# Patient Record
Sex: Female | Born: 1985 | Race: White | Hispanic: No | Marital: Married | Smoking: Current every day smoker
Health system: Southern US, Community
[De-identification: ages and names within clinical notes are randomized; demographics above are authoritative.]

## PROBLEM LIST (undated history)

## (undated) DIAGNOSIS — F329 Major depressive disorder, single episode, unspecified: Secondary | ICD-10-CM

## (undated) DIAGNOSIS — K5792 Diverticulitis of intestine, part unspecified, without perforation or abscess without bleeding: Secondary | ICD-10-CM

## (undated) DIAGNOSIS — R011 Cardiac murmur, unspecified: Secondary | ICD-10-CM

## (undated) DIAGNOSIS — F32A Depression, unspecified: Secondary | ICD-10-CM

## (undated) DIAGNOSIS — I10 Essential (primary) hypertension: Secondary | ICD-10-CM

## (undated) DIAGNOSIS — R112 Nausea with vomiting, unspecified: Secondary | ICD-10-CM

## (undated) DIAGNOSIS — K529 Noninfective gastroenteritis and colitis, unspecified: Secondary | ICD-10-CM

## (undated) DIAGNOSIS — R519 Headache, unspecified: Secondary | ICD-10-CM

## (undated) DIAGNOSIS — F909 Attention-deficit hyperactivity disorder, unspecified type: Secondary | ICD-10-CM

## (undated) DIAGNOSIS — Z9889 Other specified postprocedural states: Secondary | ICD-10-CM

## (undated) DIAGNOSIS — R51 Headache: Secondary | ICD-10-CM

## (undated) DIAGNOSIS — K219 Gastro-esophageal reflux disease without esophagitis: Secondary | ICD-10-CM

## (undated) HISTORY — DX: Diverticulitis of intestine, part unspecified, without perforation or abscess without bleeding: K57.92

## (undated) HISTORY — DX: Essential (primary) hypertension: I10

## (undated) HISTORY — DX: Attention-deficit hyperactivity disorder, unspecified type: F90.9

---
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
## 2008-08-05 ENCOUNTER — Emergency Department (HOSPITAL_COMMUNITY): Admission: EM | Admit: 2008-08-05 | Discharge: 2008-08-05 | Payer: Self-pay | Admitting: Family Medicine

## 2009-03-04 HISTORY — PX: COLON SURGERY: SHX602

## 2015-10-06 DIAGNOSIS — R0602 Shortness of breath: Secondary | ICD-10-CM | POA: Diagnosis not present

## 2015-10-06 DIAGNOSIS — Z0000 Encounter for general adult medical examination without abnormal findings: Secondary | ICD-10-CM | POA: Diagnosis not present

## 2015-10-06 DIAGNOSIS — Z114 Encounter for screening for human immunodeficiency virus [HIV]: Secondary | ICD-10-CM | POA: Diagnosis not present

## 2015-10-06 DIAGNOSIS — Z1389 Encounter for screening for other disorder: Secondary | ICD-10-CM | POA: Diagnosis not present

## 2015-10-13 MED FILL — AMLODIPINE BESYLATE 10 MG T: 10 | 30 days supply | Qty: 30 | Fill #0

## 2015-11-02 ENCOUNTER — Telehealth: Payer: Self-pay | Admitting: *Deleted

## 2015-11-02 NOTE — Telephone Encounter (Signed)
Unable to reach patient at time of Pre-Visit Call.  Left message for patient to return call when available.    

## 2015-11-03 ENCOUNTER — Ambulatory Visit (INDEPENDENT_AMBULATORY_CARE_PROVIDER_SITE_OTHER): Payer: 59 | Admitting: Family Medicine

## 2015-11-03 ENCOUNTER — Encounter: Payer: Self-pay | Admitting: Family Medicine

## 2015-11-03 VITALS — BP 130/80 | HR 107 | Temp 98.1°F | Ht 64.0 in | Wt 170.6 lb

## 2015-11-03 DIAGNOSIS — F909 Attention-deficit hyperactivity disorder, unspecified type: Secondary | ICD-10-CM

## 2015-11-03 DIAGNOSIS — F900 Attention-deficit hyperactivity disorder, predominantly inattentive type: Secondary | ICD-10-CM | POA: Insufficient documentation

## 2015-11-03 DIAGNOSIS — F902 Attention-deficit hyperactivity disorder, combined type: Secondary | ICD-10-CM

## 2015-11-03 DIAGNOSIS — R5382 Chronic fatigue, unspecified: Secondary | ICD-10-CM

## 2015-11-03 DIAGNOSIS — R635 Abnormal weight gain: Secondary | ICD-10-CM | POA: Diagnosis not present

## 2015-11-03 HISTORY — DX: Attention-deficit hyperactivity disorder, unspecified type: F90.9

## 2015-11-03 LAB — TSH: TSH: 1.31 u[IU]/mL (ref 0.35–4.50)

## 2015-11-03 MED ORDER — AMPHETAMINE-DEXTROAMPHET ER 20 MG PO CP24: 20.0000 mg | ORAL_CAPSULE | ORAL | 0 refills | Status: DC

## 2015-11-03 MED FILL — DEXTROAMP-AMPHET ER 20 MG C: 20 | 30 days supply | Qty: 30 | Fill #0

## 2015-11-03 NOTE — Progress Notes (Signed)
Chief Complaint  Patient presents with  . Establish Care    Pt needs to discuss restarting ADHD medications, and want thyroid checked.       New Patient Visit SUBJECTIVE: HPI: Nichole Mcintosh is an 30 y.o.female who is being seen for establishing care.  [facility]. UTD with health maintenance except for pap- follows with GYN for that.  ADHD Ritalin In the past that worked well, never been on anything else. She has had symptoms since she was a small child.  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 Denies hx of weight loss, poor appetite, facial tics or insomnia while on the Ritalin.   HTN On Norvasc, BP doing well. No CP or SOB. Exercise and diet could be better. No issues or compliance issues with medication.  Fatigue She also notes that she has fatigue for most of the day in addition to weight gain. She would like to have her thyroid checked.  No Known Allergies  Past Medical History:  Diagnosis Date  . ADHD (attention deficit hyperactivity disorder)   . Attention deficit hyperactivity disorder (ADHD) 11/03/2015  . Diverticulitis   . Hypertension    Past Surgical History:  Procedure Laterality Date  . COLON SURGERY     Social History   Social History  . Marital status: Married   Social History Main Topics  . Smoking status: Former Smoker    Types: Cigarettes    Quit date: 11/02/2012  . Smokeless tobacco: Never Used  . Alcohol use Yes  . Drug use: No   Family History  Problem Relation Age of Onset  . Hypertension Mother   . Heart disease Maternal Grandmother   . Cancer Maternal Grandmother     Lung  . Heart disease Maternal Grandfather     Current Outpatient Prescriptions:  .  amLODipine (NORVASC) 10 MG tablet, Take 10 mg by mouth daily., Disp: , Rfl:  .  amphetamine-dextroamphetamine (ADDERALL XR) 20 MG 24 hr capsule, Take 1 capsule (20 mg total) by mouth every morning.,  Disp: 30 capsule, Rfl: 0  Patient's last menstrual period was 10/23/2015 (exact date).  ROS Cardiovascular: Denies chest pain or pressure, palpitations  Psych: +inattention and forgetfulness   OBJECTIVE: BP 130/80 (BP Location: Left Arm, Patient Position: Sitting, Cuff Size: Normal)   Pulse (!) 107   Temp 98.1 F (36.7 C) (Oral)   Ht 5\' 4"  (1.626 m)   Wt 170 lb 9.6 oz (77.4 kg)   LMP 10/23/2015 (Exact Date)   SpO2 98%   BMI 29.28 kg/m   Constitutional: -  VS reviewed -  Well developed, well nourished, appears stated age -  No apparent distress  Psychiatric: -  Oriented to person, place, and time -  Memory intact -  Affect and mood normal -  Fluent conversation, good eye contact -  Judgment and insight age appropriate  Eye: -  Conjunctivae clear, no discharge -  Pupils symmetric, round, reactive to light  ENMT: -  Ears are patent b/l without erythema or discharge. TM's are shiny and clear b/l without evidence of effusion or infection. -  Oral mucosa without lesions, tongue and uvula midline    Tonsils not enlarged, no erythema, no exudate, trachea midline    Pharynx moist, no lesions, no erythema  Neck: -  No gross swelling, no palpable masses -  Thyroid midline, not enlarged, mobile, no palpable masses  Cardiovascular: -  RRR, no murmurs- she was not tachycardic when I auscultated -  No LE edema  Respiratory: -  Normal respiratory effort, no accessory muscle use, no retraction -  Breath sounds equal, no wheezes, no ronchi, no crackles  Neurological:  -  CN II - XII grossly intact -  No facial tics -  Sensation grossly intact to light touch, equal bilaterally  Musculoskeletal: -  No clubbing, no cyanosis -  Gait normal  Skin: -  No significant lesion on inspection -  Warm and dry to palpation   ASSESSMENT/PLAN: Attention deficit hyperactivity disorder (ADHD), combined type - Plan: amphetamine-dextroamphetamine (ADDERALL XR) 20 MG 24 hr capsule  Weight gain - Plan:  TSH  Chronic fatigue - Plan: TSH  Patient instructed to sign release of records form from her previous PCP. Orders as above. Controlled substance contract signed. Reviewed her labs and her LDL is 188. Will consider rechecking in future. Did bring up possibility of cholesterol lowering medication despite age. Patient should return in 4 weeks to recheck ADHD medication. The patient voiced understanding and agreement to the plan.   Nichole Mcintosh

## 2015-11-03 NOTE — Progress Notes (Signed)
Pre visit review using our clinic review tool, if applicable. No additional management support is needed unless otherwise documented below in the visit note. 

## 2015-11-03 NOTE — Patient Instructions (Signed)
Power Step are OTC insoles that are cheapest on [employer].

## 2015-11-10 MED FILL — AMLODIPINE BESYLATE 10 MG T: 10 | 60 days supply | Qty: 60 | Fill #1 | Status: TO

## 2015-11-24 ENCOUNTER — Encounter: Payer: Self-pay | Admitting: Family Medicine

## 2015-12-01 ENCOUNTER — Ambulatory Visit: Payer: 59 | Admitting: Family Medicine

## 2015-12-08 ENCOUNTER — Ambulatory Visit (INDEPENDENT_AMBULATORY_CARE_PROVIDER_SITE_OTHER): Payer: 59 | Admitting: Family Medicine

## 2015-12-08 ENCOUNTER — Encounter: Payer: Self-pay | Admitting: Family Medicine

## 2015-12-08 VITALS — BP 138/80 | HR 103 | Temp 98.1°F | Wt 168.8 lb

## 2015-12-08 DIAGNOSIS — F900 Attention-deficit hyperactivity disorder, predominantly inattentive type: Secondary | ICD-10-CM | POA: Diagnosis not present

## 2015-12-08 DIAGNOSIS — Z79899 Other long term (current) drug therapy: Secondary | ICD-10-CM | POA: Diagnosis not present

## 2015-12-08 DIAGNOSIS — I10 Essential (primary) hypertension: Secondary | ICD-10-CM | POA: Insufficient documentation

## 2015-12-08 MED ORDER — METOPROLOL SUCCINATE ER 50 MG PO TB24: 50.0000 mg | ORAL_TABLET | Freq: Every day | ORAL | 1 refills | Status: DC

## 2015-12-08 MED ORDER — AMPHETAMINE-DEXTROAMPHETAMINE 10 MG PO TABS: ORAL_TABLET | ORAL | 0 refills | Status: DC

## 2015-12-08 MED ORDER — AMPHETAMINE-DEXTROAMPHET ER 20 MG PO CP24: 20.0000 mg | ORAL_CAPSULE | ORAL | 0 refills | Status: DC

## 2015-12-08 MED FILL — METOPROLOL SUCC ER 50 MG TA: 50 | 60 days supply | Qty: 60 | Fill #0

## 2015-12-08 MED FILL — DEXTROAMP-AMPHETAMIN 10 MG: 10 | 30 days supply | Qty: 30 | Fill #0

## 2015-12-08 MED FILL — DEXTROAMP-AMPHET ER 20 MG C: 20 | 30 days supply | Qty: 30 | Fill #0

## 2015-12-08 NOTE — Progress Notes (Signed)
Pre visit review using our clinic review tool, if applicable. No additional management support is needed unless otherwise documented below in the visit note. 

## 2015-12-08 NOTE — Progress Notes (Signed)
Chief Complaint  Patient presents with  . ADHD    Subjective: Patient is a 30 y.o. female here for ADHD f/u.  Hx of ADHD, stopped meds for a while, started up again for work. 20 mg Adderall XR on days she works. She is a Engineer, civil (consulting)nurse. She feels her symptoms are well controlled in AM and the medication tapers off around 3-4 PM. No facial tics, weight loss, insomnia, or palpitations  Hypertension Patient presents for hypertension follow up. She does monitor home blood pressures. Blood pressures ranging 140's/80's. She is compliant with medications- Norvasc 10 mg daily. Patient has these side effects of medication: none She specifically denies headache, visual changes, chest pain, palpitations, dyspnea, orthopnea, PND or peripheral edema. She is adhering to a low sodium and low fat diet. She exercises rarely.   ROS: Heart: Denies palpitations Neuro: As noted in HPI  Family History  Problem Relation Age of Onset  . Hypertension Mother   . Heart disease Maternal Grandmother   . Cancer Maternal Grandmother     Lung  . Heart disease Maternal Grandfather    Past Medical History:  Diagnosis Date  . Attention deficit hyperactivity disorder (ADHD) 11/03/2015  . Diverticulitis   . Hypertension    No Known Allergies  Current Outpatient Prescriptions:  .  amLODipine (NORVASC) 10 MG tablet, Take 10 mg by mouth daily., Disp: , Rfl:  .  amphetamine-dextroamphetamine (ADDERALL XR) 20 MG 24 hr capsule, Take 1 capsule (20 mg total) by mouth every morning., Disp: 30 capsule, Rfl: 0 .  amphetamine-dextroamphetamine (ADDERALL) 10 MG tablet, Take 1 tab in the afternoon you work., Disp: 30 tablet, Rfl: 0 .  metoprolol succinate (TOPROL-XL) 50 MG 24 hr tablet, Take 1 tablet (50 mg total) by mouth daily. Take with or immediately following a meal., Disp: 30 tablet, Rfl: 1  Objective: BP 138/80 (BP Location: Left Arm, Patient Position: Sitting, Cuff Size: Normal)   Pulse (!) 103   Temp 98.1 F (36.7 C)  (Oral)   Wt 168 lb 12.8 oz (76.6 kg)   SpO2 97%   BMI 28.97 kg/m  General: Awake, appears stated age Heart: RRR when I listened, no murmurs, no LE edema Lungs: CTAB, no rales, wheezes or rhonchi. Normal effort  Abd: BS+, soft, NT, ND, no masses or organomegaly Psych: Age appropriate judgment and insight, normal affect and mood  Assessment and Plan: Attention deficit hyperactivity disorder (ADHD), predominantly inattentive type  Essential hypertension - Plan: metoprolol succinate (TOPROL-XL) 50 MG 24 hr tablet  Attention deficit hyperactivity disorder (ADHD), combined type - Plan: amphetamine-dextroamphetamine (ADDERALL) 10 MG tablet, amphetamine-dextroamphetamine (ADDERALL XR) 20 MG 24 hr capsule  Orders as above. Add 10 mg in afternoon for her 12 hour shift. I asked if she is an anxious person, she said it is due to work, but she believes she is coping well. Will keep an eye on this.  Given her pulse, will add Toprol. She is prehypertensive today. Will follow up in 4 weeks to recheck ADHD and BP. UDS collected today. The patient voiced understanding and agreement to the plan.  Jilda Rocheicholas Paul [place], DO 12/08/15  1:21 PM

## 2015-12-08 NOTE — Patient Instructions (Signed)
Take your 2nd pill (10 mg tab) around 1-2 in the afternoon.

## 2016-01-09 ENCOUNTER — Other Ambulatory Visit: Payer: Self-pay | Admitting: Family Medicine

## 2016-01-09 DIAGNOSIS — F900 Attention-deficit hyperactivity disorder, predominantly inattentive type: Secondary | ICD-10-CM

## 2016-01-09 MED FILL — AMLODIPINE BESYLATE 10 MG T: 10 | 30 days supply | Qty: 30 | Fill #0

## 2016-01-09 NOTE — Telephone Encounter (Signed)
Pt would like to know if she could have a 90 day supply if possible.     When spoke with pt she says that she would like to have [facility] instead

## 2016-01-10 ENCOUNTER — Other Ambulatory Visit: Payer: Self-pay | Admitting: Family Medicine

## 2016-01-10 DIAGNOSIS — F900 Attention-deficit hyperactivity disorder, predominantly inattentive type: Secondary | ICD-10-CM

## 2016-01-10 MED ORDER — AMPHETAMINE-DEXTROAMPHET ER 20 MG PO CP24: 20.0000 mg | ORAL_CAPSULE | ORAL | 0 refills | Status: DC

## 2016-01-17 DIAGNOSIS — H5212 Myopia, left eye: Secondary | ICD-10-CM | POA: Diagnosis not present

## 2016-01-17 DIAGNOSIS — H52223 Regular astigmatism, bilateral: Secondary | ICD-10-CM | POA: Diagnosis not present

## 2016-01-18 MED FILL — DEXTROAMP-AMPHET ER 20 MG C: 20 | 30 days supply | Qty: 30 | Fill #0

## 2016-01-19 ENCOUNTER — Ambulatory Visit: Payer: 59 | Admitting: Family Medicine

## 2016-02-01 ENCOUNTER — Encounter: Payer: 59 | Admitting: Obstetrics & Gynecology

## 2016-02-05 ENCOUNTER — Ambulatory Visit (INDEPENDENT_AMBULATORY_CARE_PROVIDER_SITE_OTHER): Payer: 59 | Admitting: Family Medicine

## 2016-02-05 ENCOUNTER — Encounter: Payer: Self-pay | Admitting: Family Medicine

## 2016-02-05 ENCOUNTER — Other Ambulatory Visit: Payer: Self-pay | Admitting: Family Medicine

## 2016-02-05 VITALS — BP 124/84 | HR 99 | Temp 97.6°F | Ht 64.0 in | Wt 161.2 lb

## 2016-02-05 DIAGNOSIS — L02429 Furuncle of limb, unspecified: Secondary | ICD-10-CM

## 2016-02-05 DIAGNOSIS — F900 Attention-deficit hyperactivity disorder, predominantly inattentive type: Secondary | ICD-10-CM

## 2016-02-05 DIAGNOSIS — F418 Other specified anxiety disorders: Secondary | ICD-10-CM

## 2016-02-05 DIAGNOSIS — I10 Essential (primary) hypertension: Secondary | ICD-10-CM | POA: Diagnosis not present

## 2016-02-05 MED ORDER — AMLODIPINE BESYLATE 10 MG PO TABS: 10.0000 mg | ORAL_TABLET | Freq: Every day | ORAL | 1 refills | Status: DC

## 2016-02-05 MED ORDER — MUPIROCIN 2 % EX OINT: 1.0000 "application " | TOPICAL_OINTMENT | Freq: Two times a day (BID) | CUTANEOUS | 0 refills | Status: DC

## 2016-02-05 MED ORDER — SERTRALINE HCL 50 MG PO TABS: 50.0000 mg | ORAL_TABLET | Freq: Every day | ORAL | 3 refills | Status: DC

## 2016-02-05 MED FILL — SERTRALINE HCL 50 MG TABLET: 50 | 30 days supply | Qty: 30 | Fill #0

## 2016-02-05 MED FILL — AMLODIPINE BESYLATE 10 MG T: 10 | 90 days supply | Qty: 90 | Fill #0

## 2016-02-05 MED FILL — METOPROLOL SUCC ER 50 MG TA: 50 | 90 days supply | Qty: 90 | Fill #0

## 2016-02-05 MED FILL — MUPIROCIN 2% OINTMENT: 2 | 7 days supply | Qty: 22 | Fill #0

## 2016-02-05 NOTE — Progress Notes (Signed)
Pre visit review using our clinic review tool, if applicable. No additional management support is needed unless otherwise documented below in the visit note. 

## 2016-02-05 NOTE — Progress Notes (Signed)
Chief Complaint  Patient presents with  . Follow-up    Pt reports having a bump under the arm with soreness and round    Nichole Mcintosh is 30 y.o. female here for ADHD follow up.  Patient is currently on Adderall 20 mg XR in AM and 10 mg in afternoon and compliance is excellent. Symptoms include inattention. Side effects include insomnia if she takes her second dose too late. Patient believes their dose should be unchanged. Denies tics, weight loss, difficulties with sleep, self-medication, alcohol/drug abuse, chest pain, or palpitations. Does have stressors a work and home, notices that her anxiety level appears to correlate with her concentration level. Is interested in starting something for anxiety. No thoughts of harming self or others. She does not follow with a counselor.  Hypertension Patient presents for hypertension follow up. She does monitor home blood pressures. Blood pressures ranging in 120's over 90's on average- she attributes it to stress. She is compliant with medications -Metoprolol 50 mg XL, Norvasc 10 mg daily. Patient has these side effects of medication: none She specifically denies headache, visual changes, chest pain, palpitations, dyspnea, orthopnea, PND or peripheral edema. She is sometimes adhering to a healthy diet. She exercises intermittently.  Skin Yesterday, she noticed an areas of pain and redness under her L axilla. She had shaved the day prior. No drainage, fevers, or spreading redness.    ROS:  Heart- denies chest pain or palpitations Psych- as noted in HPI  Past Medical History:  Diagnosis Date  . Attention deficit hyperactivity disorder (ADHD) 11/03/2015  . Diverticulitis   . Hypertension    Social History   Social History  . Marital status: Married   Social History Main Topics  . Smoking status: Former Smoker    Types: Cigarettes    Quit date: 11/02/2012  . Smokeless tobacco: Never Used  . Alcohol use Yes  . Drug use: No      Medication List       Accurate as of 02/05/16  1:53 PM. Always use your most recent med list.          amLODipine 10 MG tablet Commonly known as:  NORVASC Take 1 tablet (10 mg total) by mouth daily.   amphetamine-dextroamphetamine 10 MG tablet Commonly known as:  ADDERALL Take 1 tab in the afternoon you work.   amphetamine-dextroamphetamine 20 MG 24 hr capsule Commonly known as:  ADDERALL XR Take 1 capsule (20 mg total) by mouth every morning. Start taking on:  02/09/2016   amphetamine-dextroamphetamine 20 MG 24 hr capsule Commonly known as:  ADDERALL XR Take 1 capsule (20 mg total) by mouth every morning. Start taking on:  03/10/2016   metoprolol succinate 50 MG 24 hr tablet Commonly known as:  TOPROL-XL Take 1 tablet (50 mg total) by mouth daily. Take with or immediately following a meal.   mupirocin ointment 2 % Commonly known as:  BACTROBAN Place 1 application into the nose 2 (two) times daily.   sertraline 50 MG tablet Commonly known as:  ZOLOFT Take 1 tablet (50 mg total) by mouth daily. Take 1/2 tab for the first 2 weeks.       BP 124/84 (BP Location: Left Arm, Patient Position: Sitting, Cuff Size: Small)   Pulse 99   Temp 97.6 F (36.4 C) (Oral)   Ht 5\' 4"  (1.626 m)   Wt 161 lb 3.2 oz (73.1 kg)   LMP 01/29/2016   SpO2 98%   BMI 27.67 kg/m  Gen- awake, alert,  appearing stated age HEENT- PERRLA, MMM Heart- RRR, no murmurs, no LE edema Lungs- CTAB, no accessory muscle use Abd- soft, NT, ND, no masses or organomegaly Neuro- no facial tics Skin: L axilla, there is a small area of erythema approximately 0.9 cm x 0.3 cm in diameter, mildly TTP, no drainage or fluctuance. 15Psych- age appropriate judgment and insight, normal mood and affect  Attention deficit hyperactivity disorder (ADHD), predominantly inattentive type  Essential hypertension - Plan: amLODipine (NORVASC) 10 MG tablet  Furuncle of axilla - Plan: mupirocin ointment (BACTROBAN) 2  %  Situational anxiety - Plan: sertraline (ZOLOFT) 50 MG tablet  Orders as above. Continue current dosage. BP OK today. Will have her keep a close eye on home BP's. Skin lesion does not appear to be an abscess. Start Zoloft. Number for counseling also given. F/u in 1 mo to recheck anxiety Pt voiced understanding and agreement to the plan.  Nichole Rocheicholas Paul [place], DO 02/05/16 1:53 PM

## 2016-02-05 NOTE — Patient Instructions (Signed)
Please consider counseling. The medical literature and evidence-based guidelines support it. Contact [phone] to schedule an appointment or inquire about cost/insurance coverage.  

## 2016-02-05 NOTE — Telephone Encounter (Signed)
Pt was seen 02/05/2016 and informed to follow up in a month.  Refilled Rx for #30 tablets #2 refills. TL/CMA

## 2016-02-15 MED FILL — DEXTROAMP-AMPHET ER 20 MG C: 20 | 30 days supply | Qty: 30 | Fill #0

## 2016-03-07 MED FILL — SERTRALINE HCL 50 MG TABLET: 50 | 30 days supply | Qty: 30 | Fill #1

## 2016-03-28 MED FILL — ADDERALL XR 20 MG CAP SA: 20 | 30 days supply | Qty: 30 | Fill #0

## 2016-04-15 ENCOUNTER — Ambulatory Visit: Payer: 59 | Admitting: Family Medicine

## 2016-04-26 ENCOUNTER — Telehealth: Payer: Self-pay | Admitting: Family Medicine

## 2016-04-26 DIAGNOSIS — F900 Attention-deficit hyperactivity disorder, predominantly inattentive type: Secondary | ICD-10-CM

## 2016-04-26 NOTE — Telephone Encounter (Signed)
Caller name: Relationship to patient: Self Can be reached:[phone] Pharmacy:  Reason for call: Refill on Adderall both capsules and tablets

## 2016-04-29 MED ORDER — AMPHETAMINE-DEXTROAMPHET ER 20 MG PO CP24: 20.0000 mg | ORAL_CAPSULE | ORAL | 0 refills | Status: DC

## 2016-04-29 MED ORDER — AMPHETAMINE-DEXTROAMPHETAMINE 10 MG PO TABS: ORAL_TABLET | ORAL | 0 refills | Status: DC

## 2016-04-29 NOTE — Telephone Encounter (Signed)
Per Dr. Carmelia RollerWendling recommendation ok to refill Rx for Adderall. Rx given to physician for sig. TL/CMA

## 2016-04-29 NOTE — Telephone Encounter (Signed)
OK to refill. TY.  

## 2016-04-29 NOTE — Telephone Encounter (Signed)
Pt called in to follow up on her Rx. Advised pt of current Rx status. Pt says that she would like to come in and pick up in a hr or so if possible.

## 2016-04-29 NOTE — Addendum Note (Signed)
Addended by: Kandace BlitzLONG, Merleen Picazo M on: 04/29/2016 04:59 PM   Modules accepted: Orders

## 2016-04-29 NOTE — Addendum Note (Signed)
Addended by: Kandace BlitzLONG, Bowen Goyal M on: 04/29/2016 10:58 AM   Modules accepted: Orders

## 2016-04-29 NOTE — Telephone Encounter (Signed)
Please advise. TL/CMA 

## 2016-04-30 ENCOUNTER — Other Ambulatory Visit: Payer: Self-pay | Admitting: Family Medicine

## 2016-04-30 DIAGNOSIS — I10 Essential (primary) hypertension: Secondary | ICD-10-CM

## 2016-04-30 DIAGNOSIS — F900 Attention-deficit hyperactivity disorder, predominantly inattentive type: Secondary | ICD-10-CM

## 2016-04-30 MED ORDER — AMPHETAMINE-DEXTROAMPHET ER 20 MG PO CP24: 20.0000 mg | ORAL_CAPSULE | ORAL | 0 refills | Status: DC

## 2016-04-30 MED FILL — AMLODIPINE BESYLATE 10 MG T: 10 | 90 days supply | Qty: 90 | Fill #1

## 2016-04-30 MED FILL — ADDERALL XR 20 MG CAP SA: 20 | 30 days supply | Qty: 30 | Fill #0

## 2016-04-30 MED FILL — DEXTROAMP-AMPHETAMIN 10 MG: 10 | 30 days supply | Qty: 30 | Fill #0

## 2016-04-30 NOTE — Telephone Encounter (Signed)
Patient brought in rx for Adderall XR 20mg  that was written yesterday.  It had a fill date of 05/27/16 which was wrong.  Assumed that assistant that filled yesterday thought it was the same mg as her other adderall 10mg .  New rx was given from Dr. Laury AxonLowne in Dr. Carmelia RollerWendling absence.

## 2016-05-06 ENCOUNTER — Telehealth: Payer: Self-pay | Admitting: Family Medicine

## 2016-05-06 ENCOUNTER — Ambulatory Visit: Payer: Self-pay | Admitting: Family Medicine

## 2016-05-06 NOTE — Telephone Encounter (Signed)
No charge. TY. 

## 2016-05-06 NOTE — Telephone Encounter (Signed)
Caller name: Lelon MastSamantha Abed Relationship to patient: self Can be reached: [phone]   Reason for call: Pt called stating she was called into work today from 7am-7pm at [employer] [facility]. She said she'll find a way to come if she is going to get charged no show. Please advise.

## 2016-05-20 ENCOUNTER — Encounter: Payer: Self-pay | Admitting: Family Medicine

## 2016-05-20 ENCOUNTER — Ambulatory Visit (INDEPENDENT_AMBULATORY_CARE_PROVIDER_SITE_OTHER): Payer: 59 | Admitting: Family Medicine

## 2016-05-20 VITALS — BP 120/70 | HR 98 | Temp 98.2°F | Ht 64.0 in | Wt 158.2 lb

## 2016-05-20 DIAGNOSIS — I10 Essential (primary) hypertension: Secondary | ICD-10-CM | POA: Diagnosis not present

## 2016-05-20 DIAGNOSIS — Z9049 Acquired absence of other specified parts of digestive tract: Secondary | ICD-10-CM

## 2016-05-20 DIAGNOSIS — F900 Attention-deficit hyperactivity disorder, predominantly inattentive type: Secondary | ICD-10-CM

## 2016-05-20 DIAGNOSIS — F411 Generalized anxiety disorder: Secondary | ICD-10-CM

## 2016-05-20 MED ORDER — AMPHETAMINE-DEXTROAMPHET ER 30 MG PO CP24: 30.0000 mg | ORAL_CAPSULE | Freq: Every day | ORAL | 0 refills | Status: DC

## 2016-05-20 MED ORDER — METOPROLOL SUCCINATE ER 50 MG PO TB24: 50.0000 mg | ORAL_TABLET | Freq: Every day | ORAL | 1 refills | Status: DC

## 2016-05-20 MED ORDER — FLUOXETINE HCL 20 MG PO CAPS: 20.0000 mg | ORAL_CAPSULE | Freq: Every day | ORAL | 2 refills | Status: DC

## 2016-05-20 MED FILL — METOPROLOL SUCC ER 50 MG TA: 50 | 90 days supply | Qty: 90 | Fill #0

## 2016-05-20 MED FILL — ADDERALL XR 30 MG CAP SA: 30 | 30 days supply | Qty: 30 | Fill #0

## 2016-05-20 NOTE — Progress Notes (Signed)
Pre visit review using our clinic review tool, if applicable. No additional management support is needed unless otherwise documented below in the visit note. 

## 2016-05-20 NOTE — Progress Notes (Signed)
Chief Complaint  Patient presents with  . Follow-up    on ADHD and BP and medication refills-pt has been out of BP meds    Subjective Nichole Mcintosh is a 31 y.o. female who presents for hypertension follow up. She does not monitor home blood pressures. She is compliant with medications - Norvasc 10 mg daily and Metoprolol. Patient has these side effects of medication: none She is adhering to a healthy diet overall. Current exercise: walking a lot at work, lifting patients She has lost sizes in her clothing, but no weight loss.  ADHD Dx'd as a child, started on Adderall after period of no insurance. Initially was doing well, but not is still having issues with concentration. No side effects with medicine. Taking as rx'd.   Anxiety Hx of anxiety, started on Zoloft, did not tolerated well due to GI side effects. Considering counseling at this point. No SI or HI. Stressors include her job.  Partial Colectomy Pt has a hx of diverticulitis requiring a partial colectomy. She reports she was supposed to have a follow up (colonoscopy?) with a GI specialist, but did not have insurance. She feels some scar tissue and is having pain that has been increasing as of late.    Past Medical History:  Diagnosis Date  . Attention deficit hyperactivity disorder (ADHD) 11/03/2015  . Diverticulitis   . Hypertension    Family History  Problem Relation Age of Onset  . Hypertension Mother   . Heart disease Maternal Grandmother   . Cancer Maternal Grandmother     Lung  . Heart disease Maternal Grandfather      Medications Current Outpatient Prescriptions on File Prior to Visit  Medication Sig Dispense Refill  . amLODipine (NORVASC) 10 MG tablet Take 1 tablet (10 mg total) by mouth daily. 90 tablet 1  . amphetamine-dextroamphetamine (ADDERALL XR) 20 MG 24 hr capsule Take 1 capsule (20 mg total) by mouth every morning. 30 capsule 0  . amphetamine-dextroamphetamine (ADDERALL) 10 MG tablet Take 1 tab in  the afternoon you work. 30 tablet 0  . metoprolol succinate (TOPROL-XL) 50 MG 24 hr tablet TAKE 1 TABLET BY MOUTH DAILY. TAKE WITH OR IMMEDIATELY FOLLOWING A MEAL. 30 tablet 2  . sertraline (ZOLOFT) 50 MG tablet Take 1 tablet (50 mg total) by mouth daily. Take 1/2 tab for the first 2 weeks. 30 tablet 3   Allergies No Known Allergies  Review of Systems Cardiovascular: no chest pain Respiratory:  no shortness of breath  Exam BP 120/70 (BP Location: Left Arm, Patient Position: Sitting, Cuff Size: Normal)   Pulse 98   Temp 98.2 F (36.8 C) (Oral)   Ht 5\' 4"  (1.626 m)   Wt 158 lb 3.2 oz (71.8 kg)   LMP 04/22/2016 (Approximate)   SpO2 98%   BMI 27.15 kg/m  General:  well developed, well nourished, in no apparent distress Skin:  warm, no pallor or diaphoresis Eyes:  pupils equal and round, sclera anicteric without injection Heart :RRR, no murmurs, no bruits, no LE edema Lungs:  clear to auscultation, no accessory muscle use Abd: BS+, soft, mild TTP in LLQ, scar tissue in LLQ appreciated, no masses or organomegaly. Neuro: 2/4 patellar reflex b/l, 1/4 biceps reflex b/l, no clonus, no cerebellar signs Psych: well oriented with normal range of affect and appropriate judgment/insight  Attention deficit hyperactivity disorder (ADHD), predominantly inattentive type - Plan: amphetamine-dextroamphetamine (ADDERALL XR) 30 MG 24 hr capsule, amphetamine-dextroamphetamine (ADDERALL XR) 30 MG 24 hr capsule, amphetamine-dextroamphetamine (ADDERALL XR)  30 MG 24 hr capsule  Essential hypertension - Plan: metoprolol succinate (TOPROL-XL) 50 MG 24 hr tablet  History of colectomy - Plan: Ambulatory referral to Gastroenterology  GAD (generalized anxiety disorder) - Plan: FLUoxetine (PROZAC) 20 MG capsule  Orders as above. Increase dose of long acting Adderall. Encouraged her willingness to see a counselor. Will change Zoloft to Prozac.  Monitor home BP's to make sure she is not dropping too low with hx  of [place]at Syndrome. Will refer to GI per patient request. Counseled on diet and exercise F/u in 6 weeks. The patient voiced understanding and agreement to the plan.  Jilda Rocheicholas Paul [place], DO 05/20/16  1:51 PM

## 2016-05-20 NOTE — Patient Instructions (Signed)
Please consider counseling. The medical literature and evidence-based guidelines support it. Contact [phone] to schedule an appointment or inquire about cost/insurance coverage.  

## 2016-05-30 ENCOUNTER — Telehealth: Payer: Self-pay | Admitting: Family Medicine

## 2016-05-30 MED ORDER — VENLAFAXINE HCL ER 37.5 MG PO CP24: 37.5000 mg | ORAL_CAPSULE | Freq: Every day | ORAL | 1 refills | Status: DC

## 2016-05-30 NOTE — Telephone Encounter (Signed)
Caller name: Relationship to patient: Self Can be reached: [phone] Pharmacy:  [facility] [address] - [place], [place] - [id number] [phone] (Phone) [phone])     Reason for call: Patient states that FLUoxetine (PROZAC) 20 MG capsule  Is not working for her.

## 2016-06-03 NOTE — Telephone Encounter (Signed)
Called and spoke with the pt on (05/30/16) and she stated that the Prozac is not working.  She feels she is on a emotional rock coaster.  Informed Dr. Carmelia Roller of the pt's message and he stated that he would like for her to try Effexor 37.5mg  24 hr 1 x daily or bid if not covered,and recheck in 4 weeks.  Informed the pt of Dr. Hollie Beach recommendation.  She stated that she has tried the Effexor before and it did not work.  Informed her that Dr. Carmelia Roller had just left the office and I could not let him know that she had tried the Effexor before and it did not work.  She stated that it had been a long time ago when she tried it, but she will try it again.  She asked me to go ahead and send the prescription to the pharmacy.  Prescription sent to the pharmacy.  Pt stated that she will call back and schedule a follow-up appt in 4 weeks.//AB/CMA

## 2016-06-13 ENCOUNTER — Encounter: Payer: Self-pay | Admitting: Gastroenterology

## 2016-06-14 ENCOUNTER — Telehealth: Payer: Self-pay | Admitting: Family Medicine

## 2016-06-14 MED ORDER — CIPROFLOXACIN HCL 500 MG PO TABS: 500.0000 mg | ORAL_TABLET | Freq: Two times a day (BID) | ORAL | 0 refills | Status: DC

## 2016-06-14 MED ORDER — METRONIDAZOLE 500 MG PO TABS: 500.0000 mg | ORAL_TABLET | Freq: Three times a day (TID) | ORAL | 0 refills | Status: DC

## 2016-06-14 NOTE — Telephone Encounter (Signed)
Sent in, it would be nice if she had an appointment for something like this in the future.

## 2016-06-14 NOTE — Telephone Encounter (Signed)
Called and spoke with the pt and informed her of the message below.  Pt verbalized understanding and agreed.  She stated she will make an appt next week to come to see Dr. Wendling.//AB/CMA

## 2016-06-14 NOTE — Telephone Encounter (Signed)
Patient is having a diverticulitis flare up and needing some antibiotics GI will not see her until next month. Pharmacy is [facility] please advise [phone]

## 2016-06-14 NOTE — Telephone Encounter (Signed)
Please advise.//AB/CMA 

## 2016-06-15 ENCOUNTER — Encounter (HOSPITAL_BASED_OUTPATIENT_CLINIC_OR_DEPARTMENT_OTHER): Payer: Self-pay | Admitting: *Deleted

## 2016-06-15 ENCOUNTER — Emergency Department (HOSPITAL_BASED_OUTPATIENT_CLINIC_OR_DEPARTMENT_OTHER): Payer: 59

## 2016-06-15 ENCOUNTER — Inpatient Hospital Stay (HOSPITAL_BASED_OUTPATIENT_CLINIC_OR_DEPARTMENT_OTHER)
Admission: EM | Admit: 2016-06-15 | Discharge: 2016-06-17 | DRG: 392 | Disposition: A | Discharge status: Home / Self Care | Payer: 59 | Attending: Internal Medicine | Admitting: Internal Medicine

## 2016-06-15 DIAGNOSIS — K529 Noninfective gastroenteritis and colitis, unspecified: Secondary | ICD-10-CM | POA: Diagnosis not present

## 2016-06-15 DIAGNOSIS — R109 Unspecified abdominal pain: Secondary | ICD-10-CM | POA: Diagnosis not present

## 2016-06-15 DIAGNOSIS — K5732 Diverticulitis of large intestine without perforation or abscess without bleeding: Principal | ICD-10-CM | POA: Diagnosis present

## 2016-06-15 DIAGNOSIS — Z9049 Acquired absence of other specified parts of digestive tract: Secondary | ICD-10-CM

## 2016-06-15 DIAGNOSIS — F411 Generalized anxiety disorder: Secondary | ICD-10-CM | POA: Diagnosis not present

## 2016-06-15 DIAGNOSIS — F900 Attention-deficit hyperactivity disorder, predominantly inattentive type: Secondary | ICD-10-CM | POA: Diagnosis present

## 2016-06-15 DIAGNOSIS — F329 Major depressive disorder, single episode, unspecified: Secondary | ICD-10-CM | POA: Diagnosis present

## 2016-06-15 DIAGNOSIS — Z8249 Family history of ischemic heart disease and other diseases of the circulatory system: Secondary | ICD-10-CM

## 2016-06-15 DIAGNOSIS — R933 Abnormal findings on diagnostic imaging of other parts of digestive tract: Secondary | ICD-10-CM | POA: Diagnosis not present

## 2016-06-15 DIAGNOSIS — Z87891 Personal history of nicotine dependence: Secondary | ICD-10-CM | POA: Diagnosis not present

## 2016-06-15 DIAGNOSIS — R1032 Left lower quadrant pain: Secondary | ICD-10-CM | POA: Diagnosis not present

## 2016-06-15 DIAGNOSIS — I10 Essential (primary) hypertension: Secondary | ICD-10-CM

## 2016-06-15 DIAGNOSIS — K56609 Unspecified intestinal obstruction, unspecified as to partial versus complete obstruction: Secondary | ICD-10-CM | POA: Diagnosis not present

## 2016-06-15 DIAGNOSIS — E876 Hypokalemia: Secondary | ICD-10-CM | POA: Diagnosis not present

## 2016-06-15 DIAGNOSIS — K5730 Diverticulosis of large intestine without perforation or abscess without bleeding: Secondary | ICD-10-CM | POA: Diagnosis not present

## 2016-06-15 DIAGNOSIS — K5792 Diverticulitis of intestine, part unspecified, without perforation or abscess without bleeding: Secondary | ICD-10-CM | POA: Diagnosis not present

## 2016-06-15 DIAGNOSIS — K56699 Other intestinal obstruction unspecified as to partial versus complete obstruction: Secondary | ICD-10-CM | POA: Diagnosis not present

## 2016-06-15 DIAGNOSIS — K5909 Other constipation: Secondary | ICD-10-CM | POA: Diagnosis not present

## 2016-06-15 DIAGNOSIS — Z79899 Other long term (current) drug therapy: Secondary | ICD-10-CM

## 2016-06-15 DIAGNOSIS — K5900 Constipation, unspecified: Secondary | ICD-10-CM | POA: Diagnosis present

## 2016-06-15 HISTORY — DX: Noninfective gastroenteritis and colitis, unspecified: K52.9

## 2016-06-15 LAB — COMPREHENSIVE METABOLIC PANEL
ALK PHOS: 87 U/L (ref 38–126)
ALT: 21 U/L (ref 14–54)
ANION GAP: 12 (ref 5–15)
AST: 22 U/L (ref 15–41)
Albumin: 4.3 g/dL (ref 3.5–5.0)
BILIRUBIN TOTAL: 0.4 mg/dL (ref 0.3–1.2)
BUN: 12 mg/dL (ref 6–20)
CALCIUM: 9.6 mg/dL (ref 8.9–10.3)
CO2: 24 mmol/L (ref 22–32)
Chloride: 100 mmol/L — ABNORMAL LOW (ref 101–111)
Creatinine, Ser: 0.7 mg/dL (ref 0.44–1.00)
GFR calc Af Amer: >60 mL/min (ref >60)
Glucose, Bld: 98 mg/dL (ref 65–99)
POTASSIUM: 3.4 mmol/L — AB (ref 3.5–5.1)
Sodium: 136 mmol/L (ref 135–145)
TOTAL PROTEIN: 8.1 g/dL (ref 6.5–8.1)

## 2016-06-15 LAB — CBC WITH DIFFERENTIAL/PLATELET
BASOS PCT: 0 %
Basophils Absolute: 0 10*3/uL (ref 0.0–0.1)
EOS ABS: 0.2 10*3/uL (ref 0.0–0.7)
Eosinophils Relative: 1 %
HEMATOCRIT: 41.8 % (ref 36.0–46.0)
HEMOGLOBIN: 14.8 g/dL (ref 12.0–15.0)
Lymphocytes Relative: 11 %
Lymphs Abs: 1.9 10*3/uL (ref 0.7–4.0)
MCH: 31.2 pg (ref 26.0–34.0)
MCHC: 35.4 g/dL (ref 30.0–36.0)
MCV: 88 fL (ref 78.0–100.0)
MONOS PCT: 8 %
Monocytes Absolute: 1.5 10*3/uL — ABNORMAL HIGH (ref 0.1–1.0)
NEUTROS ABS: 14.7 10*3/uL — AB (ref 1.7–7.7)
Neutrophils Relative %: 80 %
Platelets: 310 10*3/uL (ref 150–400)
RBC: 4.75 MIL/uL (ref 3.87–5.11)
RDW: 13.5 % (ref 11.5–15.5)
WBC: 18.4 10*3/uL — ABNORMAL HIGH (ref 4.0–10.5)

## 2016-06-15 LAB — URINALYSIS, ROUTINE W REFLEX MICROSCOPIC
BILIRUBIN URINE: NEGATIVE
GLUCOSE, UA: NEGATIVE mg/dL
HGB URINE DIPSTICK: NEGATIVE
Ketones, ur: NEGATIVE mg/dL
Nitrite: NEGATIVE
PH: 7.5 (ref 5.0–8.0)
Protein, ur: NEGATIVE mg/dL
SPECIFIC GRAVITY, URINE: 1.015 (ref 1.005–1.030)

## 2016-06-15 LAB — LIPASE, BLOOD: LIPASE: 16 U/L (ref 11–51)

## 2016-06-15 LAB — URINALYSIS, MICROSCOPIC (REFLEX): RBC / HPF: NONE SEEN RBC/hpf (ref 0–5)

## 2016-06-15 LAB — HCG, QUANTITATIVE, PREGNANCY: hCG, Beta Chain, Quant, S: 2 m[IU]/mL (ref <5)

## 2016-06-15 MED ORDER — ONDANSETRON HCL 4 MG/2ML IJ SOLN
INTRAMUSCULAR | Status: AC
  Filled 2016-06-15: qty 2

## 2016-06-15 MED ORDER — ONDANSETRON HCL 4 MG/2ML IJ SOLN
4.0000 mg | Freq: Once | INTRAMUSCULAR | Status: AC
  Administered 2016-06-15: 4 mg via INTRAVENOUS
  Filled 2016-06-15: qty 2

## 2016-06-15 MED ORDER — HYDROMORPHONE HCL 1 MG/ML IJ SOLN
0.5000 mg | INTRAMUSCULAR | Status: AC | PRN
  Administered 2016-06-15 (×3): 0.5 mg via INTRAVENOUS
  Filled 2016-06-15 (×3): qty 1

## 2016-06-15 MED ORDER — SODIUM CHLORIDE 0.9 % IV SOLN
INTRAVENOUS | Status: DC
  Administered 2016-06-15: 20:00:00 via INTRAVENOUS

## 2016-06-15 MED ORDER — METRONIDAZOLE IN NACL 5-0.79 MG/ML-% IV SOLN
500.0000 mg | Freq: Once | INTRAVENOUS | Status: AC
  Administered 2016-06-15: 500 mg via INTRAVENOUS
  Filled 2016-06-15: qty 100

## 2016-06-15 MED ORDER — ONDANSETRON HCL 4 MG/2ML IJ SOLN
4.0000 mg | Freq: Once | INTRAMUSCULAR | Status: AC
  Administered 2016-06-15: 4 mg via INTRAVENOUS

## 2016-06-15 MED ORDER — SODIUM CHLORIDE 0.9 % IV BOLUS (SEPSIS)
1000.0000 mL | Freq: Once | INTRAVENOUS | Status: AC
  Administered 2016-06-15: 1000 mL via INTRAVENOUS

## 2016-06-15 MED ORDER — DICYCLOMINE HCL 10 MG/ML IM SOLN
20.0000 mg | Freq: Once | INTRAMUSCULAR | Status: AC
  Administered 2016-06-15: 20 mg via INTRAMUSCULAR
  Filled 2016-06-15: qty 2

## 2016-06-15 MED ORDER — IOPAMIDOL (ISOVUE-300) INJECTION 61%
100.0000 mL | Freq: Once | INTRAVENOUS | Status: AC | PRN
  Administered 2016-06-15: 100 mL via INTRAVENOUS

## 2016-06-15 MED ORDER — HYDROMORPHONE HCL 1 MG/ML IJ SOLN: 1.0000 mg | INTRAMUSCULAR | Status: DC | PRN

## 2016-06-15 MED ORDER — CIPROFLOXACIN IN D5W 400 MG/200ML IV SOLN
400.0000 mg | Freq: Two times a day (BID) | INTRAVENOUS | Status: DC
  Administered 2016-06-15: 400 mg via INTRAVENOUS
  Filled 2016-06-15 (×2): qty 200

## 2016-06-15 MED ORDER — MORPHINE SULFATE (PF) 4 MG/ML IV SOLN
4.0000 mg | Freq: Once | INTRAVENOUS | Status: AC
  Administered 2016-06-15: 4 mg via INTRAVENOUS
  Filled 2016-06-15: qty 1

## 2016-06-15 NOTE — ED Triage Notes (Signed)
Diverticulitis, hx of same, x several days.

## 2016-06-15 NOTE — Progress Notes (Signed)
Accepted to [facility] med-surg bed under inpatient status from [facility] (Dr. Roselyn Bering) for abdominal pain suspected secondary to colitis.   She has hx of recurrent diverticulitis, depression, and ADHD, and is s/p partial colectomy for complicated diverticulitis. She had developed abd px reminiscent of her diverticulitis and had PCP call in abx for her yesterday, but was worsening so went in to [facility] ED.   Afebrile, VSS. Labs notable for WBC 18,400. CT with colitis, possibly diverticulitis, and apparent colonic stricture. Gen surg was consulted and indicated that no surgical intervention was needed now, and not likely needed this admit, but after the infectious process has been treated, she will need surgical follow-up.   She was given Cipro, Flagyl, IV analgesia, IVF.

## 2016-06-15 NOTE — ED Notes (Signed)
EMT attempted to hook pt up. Pt in bathroom.

## 2016-06-15 NOTE — ED Provider Notes (Signed)
[facility] Provider Note   CSN: [id number] Arrival date & time: 06/15/16  1841  By signing my name below, I, Modena Jansky, attest that this documentation has been prepared under the direction and in the presence of Linwood Dibbles, MD. Electronically Signed: Modena Jansky, Scribe. 06/15/2016. 7:11 PM.  History   Chief Complaint Chief Complaint  Patient presents with  . Diverticulitis   The history is provided by the patient. No language interpreter was used.   HPI Comments: Nichole Mcintosh is a 31 y.o. female with a PMHx of diverticulitis who presents to the Emergency Department complaining of constant moderate left-sided abdominal pain that started about 3 days ago. She suspects her pain is due to diverticulitis. She has a GI appointment in about a month and was started on antibiotics until then without relief. She reports associated constipation and vomiting (one episode). Denies any fever, chills, or other complaints at this time.      PCP: Sharlene Dory, DO  Past Medical History:  Diagnosis Date  . Attention deficit hyperactivity disorder (ADHD) 11/03/2015  . Diverticulitis   . Hypertension     Patient Active Problem List   Diagnosis Date Noted  . GAD (generalized anxiety disorder) 05/20/2016  . Essential hypertension 12/08/2015  . Attention deficit hyperactivity disorder (ADHD) 11/03/2015    Past Surgical History:  Procedure Laterality Date  . COLON SURGERY  2011   Partial colectomy, diverticulitis  . TUBAL LIGATION  2004    OB History    No data available       Home Medications    Prior to Admission medications   Medication Sig Start Date End Date Taking? Authorizing Provider  amLODipine (NORVASC) 10 MG tablet Take 1 tablet (10 mg total) by mouth daily. 02/05/16 08/03/16  Jilda Roche Wendling, DO  amphetamine-dextroamphetamine (ADDERALL XR) 30 MG 24 hr capsule Take 1 capsule (30 mg total) by mouth daily. 05/20/16   Sharlene Dory, DO    amphetamine-dextroamphetamine (ADDERALL XR) 30 MG 24 hr capsule Take 1 capsule (30 mg total) by mouth daily. 06/19/16   Sharlene Dory, DO  amphetamine-dextroamphetamine (ADDERALL XR) 30 MG 24 hr capsule Take 1 capsule (30 mg total) by mouth daily. 07/19/16   Sharlene Dory, DO  amphetamine-dextroamphetamine (ADDERALL) 10 MG tablet Take 1 tab in the afternoon you work. 04/29/16   Sharlene Dory, DO  ciprofloxacin (CIPRO) 500 MG tablet Take 1 tablet (500 mg total) by mouth 2 (two) times daily. 06/14/16   Sharlene Dory, DO  FLUoxetine (PROZAC) 20 MG capsule Take 1 capsule (20 mg total) by mouth daily. 05/20/16   Sharlene Dory, DO  metoprolol succinate (TOPROL-XL) 50 MG 24 hr tablet Take 1 tablet (50 mg total) by mouth daily. Take with or immediately following a meal. 05/20/16   Sharlene Dory, DO  metroNIDAZOLE (FLAGYL) 500 MG tablet Take 1 tablet (500 mg total) by mouth 3 (three) times daily. 06/14/16   Sharlene Dory, DO  venlafaxine XR (EFFEXOR-XR) 37.5 MG 24 hr capsule Take 1 capsule (37.5 mg total) by mouth daily with breakfast. 05/30/16   Sharlene Dory, DO    Family History Family History  Problem Relation Age of Onset  . Hypertension Mother   . Heart disease Maternal Grandmother   . Cancer Maternal Grandmother     Lung  . Heart disease Maternal Grandfather     Social History Social History  Substance Use Topics  . Smoking status: Former Smoker  Types: Cigarettes    Quit date: 11/02/2012  . Smokeless tobacco: Never Used  . Alcohol use Yes     Allergies   Patient has no known allergies.   Review of Systems Review of Systems  Constitutional: Negative for chills and fever.  Gastrointestinal: Positive for abdominal pain (Left-sided), constipation and vomiting (Once).  Genitourinary: Flank pain: Left.  All other systems reviewed and are negative.    Physical Exam Updated Vital Signs BP (!) 137/91 (BP Location:  Left Arm)   Pulse 94   Temp 97.7 F (36.5 C) (Oral)   Resp 16   Ht  (1.626 m)   Wt 160 lb (72.6 kg)   LMP 05/26/2016   SpO2 100%   BMI 27.46 kg/m   Physical Exam  Constitutional: She appears well-developed and well-nourished. She appears distressed.  HENT:  Head: Normocephalic and atraumatic.  Right Ear: External ear normal.  Left Ear: External ear normal.  Eyes: Conjunctivae are normal. Right eye exhibits no discharge. Left eye exhibits no discharge. No scleral icterus.  Neck: Neck supple. No tracheal deviation present.  Cardiovascular: Normal rate, regular rhythm and intact distal pulses.   Pulmonary/Chest: Effort normal and breath sounds normal. No stridor. No respiratory distress. She has no wheezes. She has no rales.  Abdominal: Soft. Bowel sounds are normal. She exhibits no distension. There is generalized tenderness. There is guarding. There is no rigidity and no rebound.  Musculoskeletal: She exhibits no edema or tenderness.  Neurological: She is alert. She has normal strength. No cranial nerve deficit (no facial droop, extraocular movements intact, no slurred speech) or sensory deficit. She exhibits normal muscle tone. She displays no seizure activity. Coordination normal.  Skin: Skin is warm and dry. No rash noted.  Psychiatric: She has a normal mood and affect.  Nursing note and vitals reviewed.    ED Treatments / Results  DIAGNOSTIC STUDIES: Oxygen Saturation is 100% on RA, normal by my interpretation.    COORDINATION OF CARE: 7:15 PM- Pt advised of plan for treatment and pt agrees.  Labs (all labs ordered are listed, but only abnormal results are displayed) Labs Reviewed  COMPREHENSIVE METABOLIC PANEL - Abnormal; Notable for the following:       Result Value   Potassium 3.4 (*)    Chloride 100 (*)    All other components within normal limits  CBC WITH DIFFERENTIAL/PLATELET - Abnormal; Notable for the following:    WBC 18.4 (*)    Neutro Abs 14.7 (*)      Monocytes Absolute 1.5 (*)    All other components within normal limits  URINALYSIS, ROUTINE W REFLEX MICROSCOPIC - Abnormal; Notable for the following:    Leukocytes, UA SMALL (*)    All other components within normal limits  URINALYSIS, MICROSCOPIC (REFLEX) - Abnormal; Notable for the following:    Bacteria, UA RARE (*)    Squamous Epithelial / LPF 0-5 (*)    All other components within normal limits  LIPASE, BLOOD  HCG, QUANTITATIVE, PREGNANCY    EKG  EKG Interpretation None       Radiology Ct Abdomen Pelvis W Contrast  Result Date: 06/15/2016 CLINICAL DATA:  Left lower quadrant abdominal pain for 3 days. History of partial colectomy for diverticulitis in 2011. EXAM: CT ABDOMEN AND PELVIS WITH CONTRAST TECHNIQUE: Multidetector CT imaging of the abdomen and pelvis was performed using the standard protocol following bolus administration of intravenous contrast. CONTRAST:  ISOVUE-300 IOPAMIDOL (ISOVUE-300) INJECTION 61% COMPARISON:  None. FINDINGS: Lower  chest: Hypoventilatory changes in the dependent lung bases. Otherwise no acute abnormality. Hepatobiliary: Diffuse hepatic steatosis. No liver mass. No liver surface irregularity. Normal gallbladder with no radiopaque cholelithiasis. No biliary ductal dilatation. Pancreas: Normal, with no mass or duct dilation. Spleen: Normal size. No mass. Adrenals/Urinary Tract: Normal adrenals. Normal kidneys with no hydronephrosis and no renal mass. Normal bladder. Stomach/Bowel: Grossly normal stomach. Normal caliber small bowel with no small bowel wall thickening. Appendix not discretely visualized. No pericecal inflammatory changes. Status post partial sigmoid colectomy with distal colonic anastomosis in the left lower abdomen. There is diffuse mild diverticulosis throughout the remnant large-bowel. There is a large volume of stool throughout the colon, most prominent in the region of the distal colonic anastomosis with prominent distention of  the large bowel by stool in this location. There is focal circumferential colonic wall thickening with associated pericolonic fat stranding in the distended portion of the sigmoid colon just distal to the colonic anastomosis (series 2/image 61). The sigmoid colon and rectum are relatively collapsed distal to the site of colonic wall thickening. No pneumatosis. No pericolonic free air. Vascular/Lymphatic: Normal caliber abdominal aorta. Patent portal, splenic, hepatic and renal veins. No pathologically enlarged lymph nodes in the abdomen or pelvis. Reproductive: Grossly normal anteverted uterus. Right ovarian corpus luteum. No adnexal masses. Other: No pneumoperitoneum, ascites or focal fluid collection. Musculoskeletal: No aggressive appearing focal osseous lesions. Mild lower thoracic spondylosis. IMPRESSION: 1. Nonspecific focal colitis in the sigmoid colon just distal to the colonic anastomosis in the left lower abdomen. Although this may represent acute diverticulitis given underlying diffuse colonic diverticulosis, this is not certain and other infectious or inflammatory etiologies should be considered. 2. Large colonic stool volume, most prominent in the region of the distal colonic anastomosis, with apparent focal caliber transition just distal to the site of sigmoid colon wall thickening. An underlying peri-anastomotic colonic stricture is difficult to exclude. No pneumatosis or pericolonic free air. Surgical consultation advised. 3. Diffuse hepatic steatosis. Electronically Signed   By: Delbert Phenix M.D.   On: 06/15/2016 22:13    Procedures Procedures (including critical care time)  Medications Ordered in ED Medications  sodium chloride 0.9 % bolus 1,000 mL (0 mLs Intravenous Stopped 06/15/16 2020)    And  0.9 %  sodium chloride infusion ( Intravenous New Bag/Given 06/15/16 2021)  dicyclomine (BENTYL) injection 20 mg (not administered)  HYDROmorphone (DILAUDID) injection 1 mg (not administered)    ciprofloxacin (CIPRO) IVPB 400 mg (not administered)  metroNIDAZOLE (FLAGYL) IVPB 500 mg (not administered)  HYDROmorphone (DILAUDID) injection 0.5 mg (0.5 mg Intravenous Given 06/15/16 2110)  ondansetron (ZOFRAN) injection 4 mg (4 mg Intravenous Given 06/15/16 1949)  iopamidol (ISOVUE-300) 61 % injection 100 mL (100 mLs Intravenous Contrast Given 06/15/16 2119)  morphine 4 MG/ML injection 4 mg (4 mg Intravenous Given 06/15/16 2214)     Initial Impression / Assessment and Plan / ED Course  I have reviewed the triage vital signs and the nursing notes.  Pertinent labs & imaging results that were available during my care of the patient were reviewed by me and considered in my medical decision making (see chart for details).  Clinical Course as of Jun 15 2300  Sat Jun 15, 2016  2244 Discussed CT findings with patient.  Will start abx and consult with gen surg before contacting medical service for admission.  Pt will like to try bentyl for the spasm pain  [JK]  2250 Discussed with Gerkin.   Would not recommend any surgical  intervention at this time.  Will need further eval of the possible stricture once this infection resolves.  Evaluation of the stricture   [JK]    Clinical Course User Index [JK] Linwood Dibbles, MD    Patient presented to the emergency room with complaints of abdominal pain presumed to be related to diverticulitis.  CT scan does not show definite diverticulitis. She does have colitis. CT scan also shows a colonic stricture. I discussed the case with Dr Gerrit Friends.  No surgical intervention necessary at this time.  Will admit for IV abx.  Once her infection clears, will need surgical follow up  Final Clinical Impressions(s) / ED Diagnoses   Final diagnoses:  Colitis  Colonic stricture (HCC)     I personally performed the services described in this documentation, which was scribed in my presence.  The recorded information has been reviewed and is accurate.     Linwood Dibbles,  MD 06/15/16 [phone]

## 2016-06-16 ENCOUNTER — Encounter (HOSPITAL_COMMUNITY): Payer: Self-pay | Admitting: Family Medicine

## 2016-06-16 DIAGNOSIS — I10 Essential (primary) hypertension: Secondary | ICD-10-CM

## 2016-06-16 DIAGNOSIS — E876 Hypokalemia: Secondary | ICD-10-CM | POA: Diagnosis present

## 2016-06-16 DIAGNOSIS — K56699 Other intestinal obstruction unspecified as to partial versus complete obstruction: Secondary | ICD-10-CM

## 2016-06-16 DIAGNOSIS — K529 Noninfective gastroenteritis and colitis, unspecified: Secondary | ICD-10-CM

## 2016-06-16 DIAGNOSIS — F411 Generalized anxiety disorder: Secondary | ICD-10-CM

## 2016-06-16 MED ORDER — POLYETHYLENE GLYCOL 3350 17 G PO PACK
17.0000 g | PACK | Freq: Every day | ORAL | Status: DC | PRN
  Administered 2016-06-16: 17 g via ORAL
  Filled 2016-06-16: qty 1

## 2016-06-16 MED ORDER — METOPROLOL SUCCINATE ER 50 MG PO TB24
50.0000 mg | ORAL_TABLET | Freq: Every day | ORAL | Status: DC
  Administered 2016-06-16 – 2016-06-17 (×2): 50 mg via ORAL
  Filled 2016-06-16 (×2): qty 1

## 2016-06-16 MED ORDER — METRONIDAZOLE IN NACL 5-0.79 MG/ML-% IV SOLN
500.0000 mg | Freq: Three times a day (TID) | INTRAVENOUS | Status: DC
  Administered 2016-06-16 – 2016-06-17 (×5): 500 mg via INTRAVENOUS
  Filled 2016-06-16 (×7): qty 100

## 2016-06-16 MED ORDER — FLUCONAZOLE 100 MG PO TABS
150.0000 mg | ORAL_TABLET | Freq: Once | ORAL | Status: AC
  Administered 2016-06-16: 150 mg via ORAL
  Filled 2016-06-16: qty 2

## 2016-06-16 MED ORDER — DOCUSATE SODIUM 50 MG/5ML PO LIQD
100.0000 mg | Freq: Two times a day (BID) | ORAL | Status: DC
  Administered 2016-06-16: 100 mg via ORAL
  Filled 2016-06-16: qty 10

## 2016-06-16 MED ORDER — FLUOXETINE HCL 20 MG PO CAPS: 20.0000 mg | ORAL_CAPSULE | Freq: Every day | ORAL | Status: DC

## 2016-06-16 MED ORDER — ACETAMINOPHEN 650 MG RE SUPP: 650.0000 mg | Freq: Four times a day (QID) | RECTAL | Status: DC | PRN

## 2016-06-16 MED ORDER — MORPHINE SULFATE (PF) 2 MG/ML IV SOLN
1.0000 mg | INTRAVENOUS | Status: DC | PRN
  Administered 2016-06-16: 1 mg via INTRAVENOUS
  Filled 2016-06-16: qty 1

## 2016-06-16 MED ORDER — NICOTINE 14 MG/24HR TD PT24
14.0000 mg | MEDICATED_PATCH | Freq: Every day | TRANSDERMAL | Status: DC
  Administered 2016-06-16 – 2016-06-17 (×3): 14 mg via TRANSDERMAL
  Filled 2016-06-16 (×3): qty 1

## 2016-06-16 MED ORDER — ACETAMINOPHEN 325 MG PO TABS: 650.0000 mg | ORAL_TABLET | Freq: Four times a day (QID) | ORAL | Status: DC | PRN

## 2016-06-16 MED ORDER — SENNOSIDES-DOCUSATE SODIUM 8.6-50 MG PO TABS
1.0000 | ORAL_TABLET | Freq: Two times a day (BID) | ORAL | Status: DC
  Administered 2016-06-16 – 2016-06-17 (×3): 1 via ORAL
  Filled 2016-06-16 (×3): qty 1

## 2016-06-16 MED ORDER — VENLAFAXINE HCL ER 37.5 MG PO CP24
37.5000 mg | ORAL_CAPSULE | Freq: Every day | ORAL | Status: DC
  Administered 2016-06-16 – 2016-06-17 (×2): 37.5 mg via ORAL
  Filled 2016-06-16 (×4): qty 1

## 2016-06-16 MED ORDER — DICYCLOMINE HCL 20 MG PO TABS
20.0000 mg | ORAL_TABLET | Freq: Three times a day (TID) | ORAL | Status: DC | PRN
  Administered 2016-06-16 – 2016-06-17 (×5): 20 mg via ORAL
  Filled 2016-06-16 (×5): qty 1

## 2016-06-16 MED ORDER — ONDANSETRON HCL 4 MG/2ML IJ SOLN
4.0000 mg | Freq: Four times a day (QID) | INTRAMUSCULAR | Status: DC | PRN
  Administered 2016-06-16: 4 mg via INTRAVENOUS
  Filled 2016-06-16: qty 2

## 2016-06-16 MED ORDER — CIPROFLOXACIN IN D5W 400 MG/200ML IV SOLN
400.0000 mg | Freq: Two times a day (BID) | INTRAVENOUS | Status: DC
  Administered 2016-06-16 – 2016-06-17 (×3): 400 mg via INTRAVENOUS
  Filled 2016-06-16 (×4): qty 200

## 2016-06-16 MED ORDER — MAGNESIUM CITRATE PO SOLN
1.0000 | Freq: Once | ORAL | Status: AC
  Administered 2016-06-16: 1 via ORAL
  Filled 2016-06-16: qty 296

## 2016-06-16 MED ORDER — AMLODIPINE BESYLATE 10 MG PO TABS
10.0000 mg | ORAL_TABLET | Freq: Every day | ORAL | Status: DC
  Administered 2016-06-16 – 2016-06-17 (×2): 10 mg via ORAL
  Filled 2016-06-16 (×2): qty 1

## 2016-06-16 MED ORDER — SODIUM CHLORIDE 0.9 % IV SOLN: INTRAVENOUS | Status: DC

## 2016-06-16 MED ORDER — OXYCODONE HCL 5 MG PO TABS
5.0000 mg | ORAL_TABLET | ORAL | Status: DC | PRN
  Administered 2016-06-16 (×3): 5 mg via ORAL
  Filled 2016-06-16 (×3): qty 1

## 2016-06-16 MED ORDER — ENOXAPARIN SODIUM 40 MG/0.4ML ~~LOC~~ SOLN: 40.0000 mg | SUBCUTANEOUS | Status: DC

## 2016-06-16 MED ORDER — DOCUSATE SODIUM 100 MG PO CAPS: 100.0000 mg | ORAL_CAPSULE | Freq: Two times a day (BID) | ORAL | Status: DC

## 2016-06-16 MED ORDER — ONDANSETRON HCL 4 MG PO TABS: 4.0000 mg | ORAL_TABLET | Freq: Four times a day (QID) | ORAL | Status: DC | PRN

## 2016-06-16 MED ORDER — ONDANSETRON HCL 4 MG/2ML IJ SOLN
4.0000 mg | Freq: Four times a day (QID) | INTRAMUSCULAR | Status: DC | PRN
  Administered 2016-06-16 (×2): 4 mg via INTRAVENOUS
  Filled 2016-06-16: qty 2

## 2016-06-16 MED ORDER — IBUPROFEN 400 MG PO TABS
400.0000 mg | ORAL_TABLET | Freq: Four times a day (QID) | ORAL | Status: DC | PRN
Start: 2016-06-16 — End: 2016-06-17
  Administered 2016-06-16 – 2016-06-17 (×3): 400 mg via ORAL
  Filled 2016-06-16 (×3): qty 1

## 2016-06-16 NOTE — H&P (Signed)
History and Physical  Patient Name: Nichole Mcintosh     ZOX:[id number]    DOB: 24-Aug-1985    DOA: 06/15/2016 PCP: Sharlene Dory, DO   Patient coming from: Home  Chief Complaint: Abdominal pain, chills  HPI: Nichole Mcintosh is a 31 y.o. female with a past medical history significant for recurrent diverticulitis requiring partial colectomy in 2012, ADHD on Adderall and HTN who presents with 3 days LLQ pain.  The patient was in her usual state of health until about 3 days ago when she developed crampy left lower quadrant abdominal pain and chills. The pain slowly progressed until Friday, she called her PCPs office who wrote her prescription for ciprofloxacin and Flagyl, which she started Friday night.  She was able to work Saturday morning, got in three doses Flagyl and 2 of Cipro, but by Saturday night her pain was excruciating and not relieved with aceteminophen or ibuprofen so she came to the ER.  Vomited twice Satruday night.  She was constipated, had no diarrhea, hematochezia.  No sick contacts.  ED course: -Afebrile, heart rate 94, respirations and pulse is normal, blood pressure 137/91 -Na 136, K 3.4, Cr 0.7, WBC 18.4K, Hgb 14.8 -Lipase normal -Pregnancy test negative -Urinalysis small leukocytes -CT the abdomen and pelvis was obtained which showed postsurgical changes, large amount of stool in the colon, extensive diverticulosis, and stranding around the sigmoid colon consistent with diverticulitis versus other colitis, as well as appearance of stool ball suggested anastomotic stricture -The case was discussed with general surgery, who recommended outpatient surgery follow up -The patient was given IV pain medication, Bentyl, Toradol, and IV fluids as well as IV Cipro and Flagyl and TRH rest except for transfer      ROS: Review of Systems  Constitutional: Positive for chills.  Gastrointestinal: Positive for abdominal pain, constipation and nausea.  All other systems reviewed  and are negative.         Past Medical History:  Diagnosis Date  . Attention deficit hyperactivity disorder (ADHD) 11/03/2015  . Diverticulitis   . Hypertension     Past Surgical History:  Procedure Laterality Date  . COLON SURGERY  2011   Partial colectomy, diverticulitis  . 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  The patient walks unassisted.  She is a former smoker.    No Known Allergies  Family history: family history includes Cancer in her maternal grandmother; Heart disease in her maternal grandfather and maternal grandmother; Hypertension in her mother.  Prior to Admission medications   Medication Sig Start Date End Date Taking? Authorizing Provider  amLODipine (NORVASC) 10 MG tablet Take 1 tablet (10 mg total) by mouth daily. 02/05/16 08/03/16  Jilda Roche Wendling, DO  amphetamine-dextroamphetamine (ADDERALL XR) 30 MG 24 hr capsule Take 1 capsule (30 mg total) by mouth daily. 05/20/16   Sharlene Dory, DO  amphetamine-dextroamphetamine (ADDERALL XR) 30 MG 24 hr capsule Take 1 capsule (30 mg total) by mouth daily. 06/19/16   Sharlene Dory, DO  amphetamine-dextroamphetamine (ADDERALL XR) 30 MG 24 hr capsule Take 1 capsule (30 mg total) by mouth daily. 07/19/16   Sharlene Dory, DO  amphetamine-dextroamphetamine (ADDERALL) 10 MG tablet Take 1 tab in the afternoon you work. 04/29/16   Sharlene Dory, DO  ciprofloxacin (CIPRO) 500 MG tablet Take 1 tablet (500 mg total) by mouth 2 (two) times daily. 06/14/16   Sharlene Dory, DO  FLUoxetine (PROZAC) 20 MG capsule  Take 1 capsule (20 mg total) by mouth daily. 05/20/16   Sharlene Dory, DO  metoprolol succinate (TOPROL-XL) 50 MG 24 hr tablet Take 1 tablet (50 mg total) by mouth daily. Take with or immediately following a meal. 05/20/16   Sharlene Dory, DO  metroNIDAZOLE (FLAGYL) 500 MG tablet Take 1 tablet (500 mg total) by mouth 3 (three) times  daily. 06/14/16   Sharlene Dory, DO  venlafaxine XR (EFFEXOR-XR) 37.5 MG 24 hr capsule Take 1 capsule (37.5 mg total) by mouth daily with breakfast. 05/30/16   Sharlene Dory, DO       Physical Exam: BP 125/84 (BP Location: Left Arm)   Pulse 79   Temp 97.9 F (36.6 C) (Oral)   Resp 18   Ht  (1.626 m)   Wt 72.5 kg (159 lb 13.3 oz)   LMP 05/26/2016   SpO2 100%   BMI 27.44 kg/m  General appearance: Well-developed, adult female, alert and in mild distress from pain.   Eyes: Anicteric, conjunctiva pink, lids and lashes normal. PERRL.    ENT: No nasal deformity, discharge, epistaxis.  Hearing normal. OP moist without lesions.   Skin: Warm and dry.  No jaundice.  No suspicious rashes or lesions. Cardiac: RRR, nl S1-S2, no murmurs appreciated.   Respiratory: Normal respiratory rate and rhythm.  CTAB without rales or wheezes. Abdomen: Abdomen soft.  LLQ TTP. No ascites, distension, hepatosplenomegaly.   MSK: No deformities or effusions.  No cyanosis or clubbing. Neuro: Cranial nerves grossly normal. Speech is fluent.  Moves all extremities.    Psych: Sensorium intact and responding to questions, attention normal.  Behavior appropriate.  Affect normal.  Judgment and insight appear normal.     Labs on Admission:  I have personally reviewed following labs and imaging studies: CBC:  Recent Labs Lab 06/15/16 2010  WBC 18.4*  NEUTROABS 14.7*  HGB 14.8  HCT 41.8  MCV 88.0  PLT 310   Basic Metabolic Panel:  Recent Labs Lab 06/15/16 2010  NA 136  K 3.4*  CL 100*  CO2 24  GLUCOSE 98  BUN 12  CREATININE 0.70  CALCIUM 9.6   GFR: Estimated Creatinine Clearance: 97.6 mL/min (by C-G formula based on SCr of 0.7 mg/dL).  Liver Function Tests:  Recent Labs Lab 06/15/16 2010  AST 22  ALT 21  ALKPHOS 87  BILITOT 0.4  PROT 8.1  ALBUMIN 4.3    Recent Labs Lab 06/15/16 2010  LIPASE 16   No results for input(s): AMMONIA in the last 168  hours. Coagulation Profile: No results for input(s): INR, PROTIME in the last 168 hours. Cardiac Enzymes: No results for input(s): CKTOTAL, CKMB, CKMBINDEX, TROPONINI in the last 168 hours. BNP (last 3 results) No results for input(s): PROBNP in the last 8760 hours. HbA1C: No results for input(s): HGBA1C in the last 72 hours. CBG: No results for input(s): GLUCAP in the last 168 hours. Lipid Profile: No results for input(s): CHOL, HDL, LDLCALC, TRIG, CHOLHDL, LDLDIRECT in the last 72 hours. Thyroid Function Tests: No results for input(s): TSH, T4TOTAL, FREET4, T3FREE, THYROIDAB in the last 72 hours. Anemia Panel: No results for input(s): VITAMINB12, FOLATE, FERRITIN, TIBC, IRON, RETICCTPCT in the last 72 hours. Sepsis Labs: Invalid input(s): PROCALCITONIN, LACTICIDVEN No results found for this or any previous visit (from the past 240 hour(s)).       Radiological Exams on Admission: Personally reviewed CT report: Ct Abdomen Pelvis W Contrast  Result Date: 06/15/2016 CLINICAL DATA:  Left lower quadrant abdominal pain for 3 days. History of partial colectomy for diverticulitis in 2011. EXAM: CT ABDOMEN AND PELVIS WITH CONTRAST TECHNIQUE: Multidetector CT imaging of the abdomen and pelvis was performed using the standard protocol following bolus administration of intravenous contrast. CONTRAST:  ISOVUE-300 IOPAMIDOL (ISOVUE-300) INJECTION 61% COMPARISON:  None. FINDINGS: Lower chest: Hypoventilatory changes in the dependent lung bases. Otherwise no acute abnormality. Hepatobiliary: Diffuse hepatic steatosis. No liver mass. No liver surface irregularity. Normal gallbladder with no radiopaque cholelithiasis. No biliary ductal dilatation. Pancreas: Normal, with no mass or duct dilation. Spleen: Normal size. No mass. Adrenals/Urinary Tract: Normal adrenals. Normal kidneys with no hydronephrosis and no renal mass. Normal bladder. Stomach/Bowel: Grossly normal stomach. Normal caliber small bowel  with no small bowel wall thickening. Appendix not discretely visualized. No pericecal inflammatory changes. Status post partial sigmoid colectomy with distal colonic anastomosis in the left lower abdomen. There is diffuse mild diverticulosis throughout the remnant large-bowel. There is a large volume of stool throughout the colon, most prominent in the region of the distal colonic anastomosis with prominent distention of the large bowel by stool in this location. There is focal circumferential colonic wall thickening with associated pericolonic fat stranding in the distended portion of the sigmoid colon just distal to the colonic anastomosis (series 2/image 61). The sigmoid colon and rectum are relatively collapsed distal to the site of colonic wall thickening. No pneumatosis. No pericolonic free air. Vascular/Lymphatic: Normal caliber abdominal aorta. Patent portal, splenic, hepatic and renal veins. No pathologically enlarged lymph nodes in the abdomen or pelvis. Reproductive: Grossly normal anteverted uterus. Right ovarian corpus luteum. No adnexal masses. Other: No pneumoperitoneum, ascites or focal fluid collection. Musculoskeletal: No aggressive appearing focal osseous lesions. Mild lower thoracic spondylosis. IMPRESSION: 1. Nonspecific focal colitis in the sigmoid colon just distal to the colonic anastomosis in the left lower abdomen. Although this may represent acute diverticulitis given underlying diffuse colonic diverticulosis, this is not certain and other infectious or inflammatory etiologies should be considered. 2. Large colonic stool volume, most prominent in the region of the distal colonic anastomosis, with apparent focal caliber transition just distal to the site of sigmoid colon wall thickening. An underlying peri-anastomotic colonic stricture is difficult to exclude. No pneumatosis or pericolonic free air. Surgical consultation advised. 3. Diffuse hepatic steatosis. Electronically Signed   By:  Delbert Phenix M.D.   On: 06/15/2016 22:13        Assessment/Plan  1. Diverticulitis:  Diagnostic alternatives are stercoral colitis, doubtful infectious colitis, IBD. -Ciprofloxacin, flagyl IV -Clears -IV fluids -Acetaminophen, ibuprofen, or oxycodone for pain -Bentyl improved her pain dramatically, wil continue -Miralax given large stool burden   2. Hypertension:  -Continue metoprolol, amlodipine  3. Other medications:  -Continue venlafaxine -Hold Adderall  4. Hypokalemia: Mild.           DVT prophylaxis: Lovenox  Code Status: FULL  Family Communication: None present  Disposition Plan: Anticipate IV antibiotics, fluids, and pain control.   Consults called: None    Medical decision making: Patient seen at 4:00 AM on 06/16/2016.  What exists of the patient's chart was reviewed in depth and summarized above.  Clinical condition: stable.        Alberteen Sam [facility] Pager [phone]

## 2016-06-16 NOTE — Consult Note (Addendum)
[facility] Reason for Consult: Abnormal CT scan ?anastamotic stricture. Referring Physician: [facility].  Nichole Mcintosh is an 31 y.o. female.  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 CT scan on admission revealed a large amount of stool in the colon with a ? stricture at the anastomosis patient claims she suffered used to suffer from chronic constipation and had 1 bowel movement every week in 2011 when she ended up with acute diverticulitis and had a sigmoid colectomy. She claims she's done well since then until about a week ago when she started developing recurrent left lower quadrant pain her PCP called in ciprofloxacin and Flagyl for her yesterday. As her pain became worse and unbearable she presented to the emergency room for further evaluation she denies recent change in bowel habits melena or hematochezia nausea vomiting. She has 1 bowel movement every day but the school suture using desiccated and pellet like. There is no known family history of colon cancer. Reportedly her last colonoscopy was done in 2011 prior to her colectomy. This was done by a gastroenterologist in [facility]. She is scheduled to see Dr. Jolly Mango to be established for GI care at [facility] in the next couple of weeks  Past Medical History:  Diagnosis Date  . Attention deficit hyperactivity disorder (ADHD) 11/03/2015  . Diverticulitis   . Hypertension    Past Surgical History:  Procedure Laterality Date  . COLON SURGERY  2011   Partial colectomy, diverticulitis  . TUBAL LIGATION  2004   Family History  Problem Relation Age of Onset  . Hypertension Mother   . Heart disease Maternal Grandmother   . Cancer Maternal Grandmother     Lung  . Heart disease Maternal Grandfather    Social History:  reports that she quit smoking about 3 years ago. Her smoking  use included Cigarettes. She has never used smokeless tobacco. She reports that she drinks alcohol. She reports that she does not use drugs. Works as an Therapist, sports at [employer].  Allergies: No Known Allergies  Medications: I have reviewed the patient's current medications.  Results for orders placed or performed during the hospital encounter of 06/15/16 (from the past 48 hour(s))  Comprehensive metabolic panel     Status: Abnormal   Collection Time: 06/15/16  8:10 PM  Result Value Ref Range   Sodium 136 135 - 145 mmol/L   Potassium 3.4 (L) 3.5 - 5.1 mmol/L   Chloride 100 (L) 101 - 111 mmol/L   CO2 24 22 - 32 mmol/L   Glucose, Bld 98 65 - 99 mg/dL   BUN 12 6 - 20 mg/dL   Creatinine, Ser 0.70 0.44 - 1.00 mg/dL   Calcium 9.6 8.9 - 10.3 mg/dL   Total Protein 8.1 6.5 - 8.1 g/dL   Albumin 4.3 3.5 - 5.0 g/dL   AST 22 15 - 41 U/L   ALT 21 14 - 54 U/L   Alkaline Phosphatase 87 38 - 126 U/L   Total Bilirubin 0.4 0.3 - 1.2 mg/dL   GFR calc non Af Amer >60 >60 mL/min   GFR calc Af Amer >60 >60 mL/min    Comment: (NOTE) The eGFR has been calculated using the CKD EPI equation. This calculation has not been validated in all clinical situations. eGFR's persistently <60 mL/min signify possible Chronic Kidney Disease.    Anion gap 12 5 -  15  Lipase, blood     Status: None   Collection Time: 06/15/16  8:10 PM  Result Value Ref Range   Lipase 16 11 - 51 U/L  CBC WITH DIFFERENTIAL     Status: Abnormal   Collection Time: 06/15/16  8:10 PM  Result Value Ref Range   WBC 18.4 (H) 4.0 - 10.5 K/uL   RBC 4.75 3.87 - 5.11 MIL/uL   Hemoglobin 14.8 12.0 - 15.0 g/dL   HCT 41.8 36.0 - 46.0 %   MCV 88.0 78.0 - 100.0 fL   MCH 31.2 26.0 - 34.0 pg   MCHC 35.4 30.0 - 36.0 g/dL   RDW 13.5 11.5 - 15.5 %   Platelets 310 150 - 400 K/uL   Neutrophils Relative % 80 %   Neutro Abs 14.7 (H) 1.7 - 7.7 K/uL   Lymphocytes Relative 11 %   Lymphs Abs 1.9 0.7 - 4.0 K/uL   Monocytes Relative 8 %   Monocytes Absolute 1.5 (H) 0.1  - 1.0 K/uL   Eosinophils Relative 1 %   Eosinophils Absolute 0.2 0.0 - 0.7 K/uL   Basophils Relative 0 %   Basophils Absolute 0.0 0.0 - 0.1 K/uL  hCG, quantitative, pregnancy     Status: None   Collection Time: 06/15/16  8:10 PM  Result Value Ref Range   hCG, Beta Chain, Quant, S 2 <5 mIU/mL    Comment:          GEST. AGE      CONC.  (mIU/mL)   <=1 WEEK        5 - 50     2 WEEKS       50 - 500     3 WEEKS       100 - 10,000     4 WEEKS     1,000 - 30,000     5 WEEKS     3,500 - 115,000   6-8 WEEKS     12,000 - 270,000    12 WEEKS     15,000 - 220,000        FEMALE AND NON-PREGNANT FEMALE:     LESS THAN 5 mIU/mL   Urinalysis, Routine w reflex microscopic     Status: Abnormal   Collection Time: 06/15/16  8:30 PM  Result Value Ref Range   Color, Urine YELLOW YELLOW   APPearance CLEAR CLEAR   Specific Gravity, Urine 1.015 1.005 - 1.030   pH 7.5 5.0 - 8.0   Glucose, UA NEGATIVE NEGATIVE mg/dL   Hgb urine dipstick NEGATIVE NEGATIVE   Bilirubin Urine NEGATIVE NEGATIVE   Ketones, ur NEGATIVE NEGATIVE mg/dL   Protein, ur NEGATIVE NEGATIVE mg/dL   Nitrite NEGATIVE NEGATIVE   Leukocytes, UA SMALL (A) NEGATIVE  Urinalysis, Microscopic (reflex)     Status: Abnormal   Collection Time: 06/15/16  8:30 PM  Result Value Ref Range   RBC / HPF NONE SEEN 0 - 5 RBC/hpf   WBC, UA 0-5 0 - 5 WBC/hpf   Bacteria, UA RARE (A) NONE SEEN   Squamous Epithelial / LPF 0-5 (A) NONE SEEN   Ct Abdomen Pelvis W Contrast  Result Date: 06/15/2016 CLINICAL DATA:  Left lower quadrant abdominal pain for 3 days. History of partial colectomy for diverticulitis in 2011. EXAM: CT ABDOMEN AND PELVIS WITH CONTRAST TECHNIQUE: Multidetector CT imaging of the abdomen and pelvis was performed using the standard protocol following bolus administration of intravenous contrast. CONTRAST:  139m ISOVUE-300 IOPAMIDOL (ISOVUE-300) INJECTION 61%  COMPARISON:  None. FINDINGS: Lower chest: Hypoventilatory changes in the dependent  lung bases. Otherwise no acute abnormality. Hepatobiliary: Diffuse hepatic steatosis. No liver mass. No liver surface irregularity. Normal gallbladder with no radiopaque cholelithiasis. No biliary ductal dilatation. Pancreas: Normal, with no mass or duct dilation. Spleen: Normal size. No mass. Adrenals/Urinary Tract: Normal adrenals. Normal kidneys with no hydronephrosis and no renal mass. Normal bladder. Stomach/Bowel: Grossly normal stomach. Normal caliber small bowel with no small bowel wall thickening. Appendix not discretely visualized. No pericecal inflammatory changes. Status post partial sigmoid colectomy with distal colonic anastomosis in the left lower abdomen. There is diffuse mild diverticulosis throughout the remnant large-bowel. There is a large volume of stool throughout the colon, most prominent in the region of the distal colonic anastomosis with prominent distention of the large bowel by stool in this location. There is focal circumferential colonic wall thickening with associated pericolonic fat stranding in the distended portion of the sigmoid colon just distal to the colonic anastomosis (series 2/image 61). The sigmoid colon and rectum are relatively collapsed distal to the site of colonic wall thickening. No pneumatosis. No pericolonic free air. Vascular/Lymphatic: Normal caliber abdominal aorta. Patent portal, splenic, hepatic and renal veins. No pathologically enlarged lymph nodes in the abdomen or pelvis. Reproductive: Grossly normal anteverted uterus. Right ovarian corpus luteum. No adnexal masses. Other: No pneumoperitoneum, ascites or focal fluid collection. Musculoskeletal: No aggressive appearing focal osseous lesions. Mild lower thoracic spondylosis. IMPRESSION: 1. Nonspecific focal colitis in the sigmoid colon just distal to the colonic anastomosis in the left lower abdomen. Although this may represent acute diverticulitis given underlying diffuse colonic diverticulosis, this is not  certain and other infectious or inflammatory etiologies should be considered. 2. Large colonic stool volume, most prominent in the region of the distal colonic anastomosis, with apparent focal caliber transition just distal to the site of sigmoid colon wall thickening. An underlying peri-anastomotic colonic stricture is difficult to exclude. No pneumatosis or pericolonic free air. Surgical consultation advised. 3. Diffuse hepatic steatosis. Electronically Signed   By: Ilona Sorrel M.D.   On: 06/15/2016 22:13   Review of Systems  Constitutional: Negative.   HENT: Negative.   Eyes: Negative.   Respiratory: Negative.   Cardiovascular: Negative.   Gastrointestinal: Positive for abdominal pain and constipation. Negative for blood in stool, diarrhea, heartburn, melena, nausea and vomiting.  Genitourinary: Negative.   Musculoskeletal: Negative.   Skin: Negative.   Neurological: Negative.   Endo/Heme/Allergies: Negative.   Psychiatric/Behavioral: The patient is nervous/anxious.    Blood pressure 113/69, pulse 79, temperature 98.5 F (36.9 C), temperature source Oral, resp. rate 18, height 5' 4"  (1.626 m), weight 72.5 kg (159 lb 13.3 oz), last menstrual period 05/26/2016, SpO2 100 %. Physical Exam  Constitutional: She is oriented to person, place, and time. She appears well-developed and well-nourished.  HENT:  Head: Normocephalic and atraumatic.  Eyes: Conjunctivae and EOM are normal. Pupils are equal, round, and reactive to light.  Neck: Normal range of motion. Neck supple.  Cardiovascular: Normal rate and regular rhythm.   Respiratory: Effort normal and breath sounds normal.  GI: Soft. Bowel sounds are normal. She exhibits no distension and no mass. There is tenderness. There is no rebound and no guarding.  Musculoskeletal: Normal range of motion.  Neurological: She is alert and oriented to person, place, and time.  Skin: Skin is warm and dry.  Psychiatric: She has a normal mood and affect.  Her behavior is normal. Judgment and thought content normal.  Assessment/Plan: 1) LLQ pain/ Abnormal CT scan with questionable focal colitis below the anastomosis with heavy stool burden and ?anastomotic stricture/diffuse colonic diverticulosis: Plans are to to treat her acute diverticulitis with antibiotics as she has elevated white count and give her some Colace to help with his constipation plan the flexible sigmoidoscopy the near future. Dr. Kizzie Ide will resume care tomorrow.  2) Diffuse hepatic steatosis.  Nichole Mcintosh 06/16/2016, 11:15 AM

## 2016-06-16 NOTE — ED Notes (Signed)
Pt instructed to report directly to [facility], and not to make any stops. Pt instructed not to eat or drink anything. IV secured and instructed pt not to tamper with it. Pt and family verbalized understanding.

## 2016-06-16 NOTE — Progress Notes (Signed)
[facility]                                                                              Patient Demographics  Nichole Mcintosh, is a 31 y.o. female, DOB - Feb 22, 1986, ZOX:[id number]  Admit date - 06/15/2016   Admitting Physician Briscoe Deutscher, MD  Outpatient Primary MD for the patient is Sharlene Dory, DO  Outpatient specialists:   LOS - 1  days    Chief Complaint  Patient presents with  . Diverticulitis       Brief summary   Patient is a 31 year old female with history of recurrent diverticulitis requiring partial colectomy in 2012, ADHD on Adderall and hypertension presented with 3 days of progressively worsening abdominal pain. Patient was placed on oral ciprofloxacin and Flagyl by her PCP however with no significant improvement. Patient presented to ED. CT of the abdomen and pelvis showed postsurgical changes, large amount of stool in the colon, diverticulosis and stranding around the sigmoid colon consistent with diverticulitis , ? Anastomotic stricture. Case was discussed with general surgery who recommended outpatient surgery follow-up.   Assessment & Plan    Acute Diverticulitis:  - Diagnostic alternatives are stercoral colitis, doubtful infectious colitis, IBD. No history of Crohn's disease or UC - Continue clear liquid diets, a fluid hydration, IV ciprofloxacin and Flagyl. - Continue pain control. Continue Bentyl -Continue Colace,Senokot-S and MiraLAX as needed for constipation - Patient reports that she had an appointment with gastroenterology, Dr. Adela Lank next month, GI consulted    Hypertension:  -Continue metoprolol, amlodipine  ADHD, depression -Continue venlafaxine -Hold Adderall   Hypokalemia: Replaced  Code Status: full DVT Prophylaxis:  Lovenox  Family Communication: Discussed in detail with the patient, all imaging results, lab results explained to the patient   Disposition Plan:   Time Spent in minutes   25  minutes  Procedures:   Consultants:   Gastroenterology  Antimicrobials:   IV ciprofloxacin 4/15  IV Flagyl 4/14   Medications  Scheduled Meds: . amLODipine  10 mg Oral Daily  . ciprofloxacin  400 mg Intravenous Q12H  . docusate  100 mg Oral BID  . enoxaparin (LOVENOX) injection  40 mg Subcutaneous Q24H  . metoprolol succinate  50 mg Oral Daily  . metronidazole  500 mg Intravenous Q8H  . nicotine  14 mg Transdermal Daily  . senna-docusate  1 tablet Oral BID  . venlafaxine XR  37.5 mg Oral Q breakfast   Continuous Infusions: . sodium chloride 100 mL/hr at 06/16/16 0430   PRN Meds:.acetaminophen **OR** acetaminophen, dicyclomine, ibuprofen, ondansetron **OR** ondansetron (ZOFRAN) IV, oxyCODONE, polyethylene glycol   Antibiotics   Anti-infectives    Start     Dose/Rate Route Frequency Ordered Stop   06/16/16 1000  ciprofloxacin (CIPRO) IVPB 400 mg     400 mg 200 mL/hr over 60 Minutes Intravenous Every 12 hours 06/16/16 0424     06/16/16 0845  fluconazole (DIFLUCAN) tablet 150 mg     150 mg Oral  Once 06/16/16 0840 06/16/16 0859   06/16/16 0600  metroNIDAZOLE (FLAGYL) IVPB 500 mg     500 mg 100 mL/hr over 60 Minutes  Intravenous Every 8 hours 06/16/16 0424     06/15/16 2300  ciprofloxacin (CIPRO) IVPB 400 mg  Status:  Discontinued     400 mg 200 mL/hr over 60 Minutes Intravenous Every 12 hours 06/15/16 2250 06/16/16 0424   06/15/16 2300  metroNIDAZOLE (FLAGYL) IVPB 500 mg     500 mg 100 mL/hr over 60 Minutes Intravenous  Once 06/15/16 2250 06/16/16 0016        Subjective:   Nichole Mcintosh was seen and examined today.  Still having crampy abdominal pain, 6/10, worse in left lower quadrant but no nausea or vomiting. No fevers or chills.Patient denies dizziness, chest pain, shortness of breath,  new weakness, numbess, tingling. No acute events overnight.    Objective:   Vitals:   06/15/16 2208 06/15/16 2328 06/16/16 0128 06/16/16 0456  BP: 128/85 120/82 125/84  113/69  Pulse: 85 77 79 79  Resp: Temp:   97.9 F (36.6 C) 98.5 F (36.9 C)  TempSrc:   Oral Oral  SpO2: 97% 95% 100%   Weight:   72.5 kg (159 lb 13.3 oz)   Height:    (1.626 m)     Intake/Output Summary (Last 24 hours) at 06/16/16 1143 Last data filed at 06/16/16 0033  Gross per 24 hour  Intake             1550 ml  Output                0 ml  Net             1550 ml     Wt Readings from Last 3 Encounters:  06/16/16 72.5 kg (159 lb 13.3 oz)  05/20/16 71.8 kg (158 lb 3.2 oz)  02/05/16 73.1 kg (161 lb 3.2 oz)     Exam  General: Alert and oriented x 3, NAD  HEENT:    Neck: Supple, no JVD  Cardiovascular: S1 S2 auscultated, no rubs, murmurs or gallops. Regular rate and rhythm.  Respiratory: Clear to auscultation bilaterally, no wheezing, rales or rhonchi  Gastrointestinal: Soft, Diffuse but left lower quadrant worse, nondistended, + bowel sounds  Ext: no cyanosis clubbing or edema  Neuro: AAOx3, Cr N's II- XII. Strength 5/5 upper and lower extremities bilaterally  Skin: No rashes  Psych: Normal affect and demeanor, alert and oriented x3    Data Reviewed:  I have personally reviewed following labs and imaging studies  Micro Results No results found for this or any previous visit (from the past 240 hour(s)).  Radiology Reports Ct Abdomen Pelvis W Contrast  Result Date: 06/15/2016 CLINICAL DATA:  Left lower quadrant abdominal pain for 3 days. History of partial colectomy for diverticulitis in 2011. EXAM: CT ABDOMEN AND PELVIS WITH CONTRAST TECHNIQUE: Multidetector CT imaging of the abdomen and pelvis was performed using the standard protocol following bolus administration of intravenous contrast. CONTRAST:  ISOVUE-300 IOPAMIDOL (ISOVUE-300) INJECTION 61% COMPARISON:  None. FINDINGS: Lower chest: Hypoventilatory changes in the dependent lung bases. Otherwise no acute abnormality. Hepatobiliary: Diffuse hepatic steatosis. No liver mass. No  liver surface irregularity. Normal gallbladder with no radiopaque cholelithiasis. No biliary ductal dilatation. Pancreas: Normal, with no mass or duct dilation. Spleen: Normal size. No mass. Adrenals/Urinary Tract: Normal adrenals. Normal kidneys with no hydronephrosis and no renal mass. Normal bladder. Stomach/Bowel: Grossly normal stomach. Normal caliber small bowel with no small bowel wall thickening. Appendix not discretely visualized. No pericecal inflammatory changes. Status post partial sigmoid colectomy with distal  colonic anastomosis in the left lower abdomen. There is diffuse mild diverticulosis throughout the remnant large-bowel. There is a large volume of stool throughout the colon, most prominent in the region of the distal colonic anastomosis with prominent distention of the large bowel by stool in this location. There is focal circumferential colonic wall thickening with associated pericolonic fat stranding in the distended portion of the sigmoid colon just distal to the colonic anastomosis (series 2/image 61). The sigmoid colon and rectum are relatively collapsed distal to the site of colonic wall thickening. No pneumatosis. No pericolonic free air. Vascular/Lymphatic: Normal caliber abdominal aorta. Patent portal, splenic, hepatic and renal veins. No pathologically enlarged lymph nodes in the abdomen or pelvis. Reproductive: Grossly normal anteverted uterus. Right ovarian corpus luteum. No adnexal masses. Other: No pneumoperitoneum, ascites or focal fluid collection. Musculoskeletal: No aggressive appearing focal osseous lesions. Mild lower thoracic spondylosis. IMPRESSION: 1. Nonspecific focal colitis in the sigmoid colon just distal to the colonic anastomosis in the left lower abdomen. Although this may represent acute diverticulitis given underlying diffuse colonic diverticulosis, this is not certain and other infectious or inflammatory etiologies should be considered. 2. Large colonic stool  volume, most prominent in the region of the distal colonic anastomosis, with apparent focal caliber transition just distal to the site of sigmoid colon wall thickening. An underlying peri-anastomotic colonic stricture is difficult to exclude. No pneumatosis or pericolonic free air. Surgical consultation advised. 3. Diffuse hepatic steatosis. Electronically Signed   By: Delbert Phenix M.D.   On: 06/15/2016 22:13    Lab Data:  CBC:  Recent Labs Lab 06/15/16 2010  WBC 18.4*  NEUTROABS 14.7*  HGB 14.8  HCT 41.8  MCV 88.0  PLT 310   Basic Metabolic Panel:  Recent Labs Lab 06/15/16 2010  NA 136  K 3.4*  CL 100*  CO2 24  GLUCOSE 98  BUN 12  CREATININE 0.70  CALCIUM 9.6   GFR: Estimated Creatinine Clearance: 97.6 mL/min (by C-G formula based on SCr of 0.7 mg/dL). Liver Function Tests:  Recent Labs Lab 06/15/16 2010  AST 22  ALT 21  ALKPHOS 87  BILITOT 0.4  PROT 8.1  ALBUMIN 4.3    Recent Labs Lab 06/15/16 2010  LIPASE 16   No results for input(s): AMMONIA in the last 168 hours. Coagulation Profile: No results for input(s): INR, PROTIME in the last 168 hours. Cardiac Enzymes: No results for input(s): CKTOTAL, CKMB, CKMBINDEX, TROPONINI in the last 168 hours. BNP (last 3 results) No results for input(s): PROBNP in the last 8760 hours. HbA1C: No results for input(s): HGBA1C in the last 72 hours. CBG: No results for input(s): GLUCAP in the last 168 hours. Lipid Profile: No results for input(s): CHOL, HDL, LDLCALC, TRIG, CHOLHDL, LDLDIRECT in the last 72 hours. Thyroid Function Tests: No results for input(s): TSH, T4TOTAL, FREET4, T3FREE, THYROIDAB in the last 72 hours. Anemia Panel: No results for input(s): VITAMINB12, FOLATE, FERRITIN, TIBC, IRON, RETICCTPCT in the last 72 hours. Urine analysis:    Component Value Date/Time   COLORURINE YELLOW 06/15/2016 2030   APPEARANCEUR CLEAR 06/15/2016 2030   LABSPEC 1.015 06/15/2016 2030   PHURINE 7.5 06/15/2016 2030    GLUCOSEU NEGATIVE 06/15/2016 2030   HGBUR NEGATIVE 06/15/2016 2030   BILIRUBINUR NEGATIVE 06/15/2016 2030   KETONESUR NEGATIVE 06/15/2016 2030   PROTEINUR NEGATIVE 06/15/2016 2030   NITRITE NEGATIVE 06/15/2016 2030   LEUKOCYTESUR SMALL (A) 06/15/2016 2030     Ripudeep Rai M.D. [facility] 06/16/2016, 11:43 AM  Pager: [phone] Between 7am to 7pm - call Pager - [phone]  After 7pm go to [email or web address] - password [email or web address]  Call night coverage person covering after 7pm

## 2016-06-17 ENCOUNTER — Encounter (HOSPITAL_COMMUNITY): Payer: Self-pay | Admitting: General Practice

## 2016-06-17 DIAGNOSIS — E876 Hypokalemia: Secondary | ICD-10-CM

## 2016-06-17 DIAGNOSIS — K5909 Other constipation: Secondary | ICD-10-CM

## 2016-06-17 DIAGNOSIS — R1032 Left lower quadrant pain: Secondary | ICD-10-CM

## 2016-06-17 DIAGNOSIS — K5792 Diverticulitis of intestine, part unspecified, without perforation or abscess without bleeding: Secondary | ICD-10-CM

## 2016-06-17 LAB — BASIC METABOLIC PANEL
Anion gap: 7 (ref 5–15)
BUN: 7 mg/dL (ref 6–20)
CALCIUM: 8.5 mg/dL — AB (ref 8.9–10.3)
CO2: 24 mmol/L (ref 22–32)
Chloride: 107 mmol/L (ref 101–111)
Creatinine, Ser: 0.68 mg/dL (ref 0.44–1.00)
GFR calc Af Amer: >60 mL/min (ref >60)
GFR calc non Af Amer: >60 mL/min (ref >60)
GLUCOSE: 83 mg/dL (ref 65–99)
POTASSIUM: 3.3 mmol/L — AB (ref 3.5–5.1)
Sodium: 138 mmol/L (ref 135–145)

## 2016-06-17 LAB — CBC
HEMATOCRIT: 35.9 % — AB (ref 36.0–46.0)
HEMOGLOBIN: 11.8 g/dL — AB (ref 12.0–15.0)
MCH: 29.4 pg (ref 26.0–34.0)
MCHC: 32.9 g/dL (ref 30.0–36.0)
MCV: 89.5 fL (ref 78.0–100.0)
Platelets: 257 10*3/uL (ref 150–400)
RBC: 4.01 MIL/uL (ref 3.87–5.11)
RDW: 13.4 % (ref 11.5–15.5)
WBC: 11.1 10*3/uL — AB (ref 4.0–10.5)

## 2016-06-17 MED ORDER — POLYETHYLENE GLYCOL 3350 17 G PO PACK
34.0000 g | PACK | Freq: Two times a day (BID) | ORAL | Status: DC
  Administered 2016-06-17: 34 g via ORAL
  Filled 2016-06-17 (×2): qty 2

## 2016-06-17 MED ORDER — CIPROFLOXACIN HCL 500 MG PO TABS: 500.0000 mg | ORAL_TABLET | Freq: Two times a day (BID) | ORAL | 0 refills | Status: DC

## 2016-06-17 MED ORDER — SENNOSIDES-DOCUSATE SODIUM 8.6-50 MG PO TABS: 1.0000 | ORAL_TABLET | Freq: Two times a day (BID) | ORAL | 3 refills | Status: DC

## 2016-06-17 MED ORDER — OXYCODONE HCL 5 MG PO TABS: 5.0000 mg | ORAL_TABLET | Freq: Four times a day (QID) | ORAL | 0 refills | Status: DC | PRN

## 2016-06-17 MED ORDER — ONDANSETRON 4 MG PO TBDP: 4.0000 mg | ORAL_TABLET | Freq: Three times a day (TID) | ORAL | 0 refills | Status: DC | PRN

## 2016-06-17 MED ORDER — BISACODYL 10 MG RE SUPP
10.0000 mg | Freq: Once | RECTAL | Status: AC
  Administered 2016-06-17: 10 mg via RECTAL
  Filled 2016-06-17: qty 1

## 2016-06-17 MED ORDER — METRONIDAZOLE 500 MG PO TABS: 500.0000 mg | ORAL_TABLET | Freq: Three times a day (TID) | ORAL | 0 refills | Status: DC

## 2016-06-17 MED ORDER — POTASSIUM CHLORIDE CRYS ER 20 MEQ PO TBCR
40.0000 meq | EXTENDED_RELEASE_TABLET | Freq: Once | ORAL | Status: AC
  Administered 2016-06-17: 40 meq via ORAL
  Filled 2016-06-17: qty 2

## 2016-06-17 MED ORDER — POLYETHYLENE GLYCOL 3350 17 G PO PACK: 17.0000 g | PACK | Freq: Every day | ORAL | 0 refills | Status: DC | PRN

## 2016-06-17 MED ORDER — POLYETHYLENE GLYCOL 3350 17 G PO PACK: 17.0000 g | PACK | Freq: Two times a day (BID) | ORAL | 2 refills | Status: DC

## 2016-06-17 MED ORDER — DICYCLOMINE HCL 20 MG PO TABS: 20.0000 mg | ORAL_TABLET | Freq: Three times a day (TID) | ORAL | 1 refills | Status: DC | PRN

## 2016-06-17 MED ORDER — POLYETHYLENE GLYCOL 3350 17 G PO PACK: 17.0000 g | PACK | Freq: Three times a day (TID) | ORAL | Status: DC

## 2016-06-17 NOTE — Consult Note (Signed)
   [facility] CM Inpatient Consult   06/17/2016  Nichole Mcintosh 01-Jan-1986 [id number]    Came to visit [name] on behalf of [facility] Care Management/Link to Wellness program for [employer] employees/dependents with [facility] insurance. 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 She denies having any Link to Wellness needs at this time. Provided Link to [employer] and contact information. Confirmed best contact number as [phone] for post discharge call.     Raiford Noble, MSN-Ed, RN,BSN [facility] Liaison [phone]

## 2016-06-17 NOTE — Progress Notes (Signed)
Daily Rounding Note  06/17/2016, 10:36 AM  LOS: 2 days   SUBJECTIVE:   Chief complaint:  Left sided abd pain: improved but not resolved.  Resolved n/v and tolerating solid lunch.  4 BMs this AM after laxatives yesterday.   Was very hungry prior to lunch.  Wants to go home.    OBJECTIVE:         Vital signs in last 24 hours:    Temp:  [98.6 F (37 C)-98.7 F (37.1 C)] 98.6 F (37 C) (04/16 0603) Pulse Rate:  [68-77] 77 (04/16 0603) Resp:  [17-18] 18 (04/16 0603) BP: (105-110)/(58-68) 110/68 (04/16 0603) SpO2:  [97 %-98 %] 98 % (04/16 0603) Last BM Date: 06/14/16 Filed Weights   06/15/16 1857 06/16/16 0128  Weight: 72.6 kg (160 lb) 72.5 kg (159 lb 13.3 oz)   General: comfortable, looks well   Heart: RRR Chest: clear bil  Abdomen: soft, slight left sided tenderness.  Active BS.  ND  Extremities: no CCE Neuro/Psych:  Oriented x 3.  Fully alert.   Intake/Output from previous day: 04/15 0701 - 04/16 0700 In: 666 [P.O.:666] Out: -   Intake/Output this shift: No intake/output data recorded.  Lab Results:  Recent Labs  06/15/16 2010 06/17/16 0429  WBC 18.4* 11.1*  HGB 14.8 11.8*  HCT 41.8 35.9*  PLT 310 257   BMET  Recent Labs  06/15/16 2010 06/17/16 0429  NA 136 138  K 3.4* 3.3*  CL 100* 107  CO2 24 24  GLUCOSE 98 83  BUN 12 7  CREATININE 0.70 0.68  CALCIUM 9.6 8.5*   LFT  Recent Labs  06/15/16 2010  PROT 8.1  ALBUMIN 4.3  AST 22  ALT 21  ALKPHOS 87  BILITOT 0.4   PT/INR No results for input(s): LABPROT, INR in the last 72 hours. Hepatitis Panel No results for input(s): HEPBSAG, HCVAB, HEPAIGM, HEPBIGM in the last 72 hours.  Studies/Results: Ct Abdomen Pelvis W Contrast  Result Date: 06/15/2016 CLINICAL DATA:  Left lower quadrant abdominal pain for 3 days. History of partial colectomy for diverticulitis in 2011. EXAM: CT ABDOMEN AND PELVIS WITH CONTRAST TECHNIQUE: Multidetector  CT imaging of the abdomen and pelvis was performed using the standard protocol following bolus administration of intravenous contrast. CONTRAST:  ISOVUE-300 IOPAMIDOL (ISOVUE-300) INJECTION 61% COMPARISON:  None. FINDINGS: Lower chest: Hypoventilatory changes in the dependent lung bases. Otherwise no acute abnormality. Hepatobiliary: Diffuse hepatic steatosis. No liver mass. No liver surface irregularity. Normal gallbladder with no radiopaque cholelithiasis. No biliary ductal dilatation. Pancreas: Normal, with no mass or duct dilation. Spleen: Normal size. No mass. Adrenals/Urinary Tract: Normal adrenals. Normal kidneys with no hydronephrosis and no renal mass. Normal bladder. Stomach/Bowel: Grossly normal stomach. Normal caliber small bowel with no small bowel wall thickening. Appendix not discretely visualized. No pericecal inflammatory changes. Status post partial sigmoid colectomy with distal colonic anastomosis in the left lower abdomen. There is diffuse mild diverticulosis throughout the remnant large-bowel. There is a large volume of stool throughout the colon, most prominent in the region of the distal colonic anastomosis with prominent distention of the large bowel by stool in this location. There is focal circumferential colonic wall thickening with associated pericolonic fat stranding in the distended portion of the sigmoid colon just distal to the colonic anastomosis (series 2/image 61). The sigmoid colon and rectum are relatively collapsed distal to the site of colonic wall thickening. No pneumatosis. No pericolonic free air. Vascular/Lymphatic:  Normal caliber abdominal aorta. Patent portal, splenic, hepatic and renal veins. No pathologically enlarged lymph nodes in the abdomen or pelvis. Reproductive: Grossly normal anteverted uterus. Right ovarian corpus luteum. No adnexal masses. Other: No pneumoperitoneum, ascites or focal fluid collection. Musculoskeletal: No aggressive appearing focal  osseous lesions. Mild lower thoracic spondylosis. IMPRESSION: 1. Nonspecific focal colitis in the sigmoid colon just distal to the colonic anastomosis in the left lower abdomen. Although this may represent acute diverticulitis given underlying diffuse colonic diverticulosis, this is not certain and other infectious or inflammatory etiologies should be considered. 2. Large colonic stool volume, most prominent in the region of the distal colonic anastomosis, with apparent focal caliber transition just distal to the site of sigmoid colon wall thickening. An underlying peri-anastomotic colonic stricture is difficult to exclude. No pneumatosis or pericolonic free air. Surgical consultation advised. 3. Diffuse hepatic steatosis. Electronically Signed   By: Delbert Phenix M.D.   On: 06/15/2016 22:13   Scheduled Meds: . amLODipine  10 mg Oral Daily  . ciprofloxacin  400 mg Intravenous Q12H  . enoxaparin (LOVENOX) injection  40 mg Subcutaneous Q24H  . metoprolol succinate  50 mg Oral Daily  . metronidazole  500 mg Intravenous Q8H  . nicotine  14 mg Transdermal Daily  . polyethylene glycol  34 g Oral BID  . senna-docusate  1 tablet Oral BID  . venlafaxine XR  37.5 mg Oral Q breakfast   Continuous Infusions: . sodium chloride 100 mL/hr at 06/16/16 0430   PRN Meds:.acetaminophen **OR** acetaminophen, dicyclomine, ibuprofen, ondansetron **OR** ondansetron (ZOFRAN) IV, oxyCODONE   ASSESMENT:   *   Focal colitis vs focal acute diverticulitis with peri-anastomotic stricture? s/p sigmoid colectomy for diverticulitis 2011.  Day 3 Cipro/flagyl.  WBCs improve, 18.4 .. 11.1.    *  Hepatic steatosis per CT.  Normal LFTs.  *  Hypokalemia.     PLAN   *  ? Home today with completion of oral cipro/flagyl.  Will defer decision to Dr Myrtie Neither.  She is an [employer] and thus reliable.     *  Dr Carmelia Roller at [facility] [address] is PMD.  Has 07/16/16 appt to establish care with Dr Adela Lank.    Jennye Moccasin  06/17/2016, 10:36  AM Pager: [phone]

## 2016-06-17 NOTE — Progress Notes (Signed)
Nichole Mcintosh to be D/C'd  per MD order. Discussed with the patient and all questions fully answered.  VSS, Skin clean, dry and intact without evidence of skin break down, no evidence of skin tears noted.  IV catheter discontinued intact. Site without signs and symptoms of complications. Dressing and pressure applied.  An After Visit Summary was printed and given to the patient. Patient received prescription.  D/c education completed with patient/family including follow up instructions, medication list, d/c activities limitations if indicated, with other d/c instructions as indicated by MD - patient able to verbalize understanding, all questions fully answered.   Patient instructed to return to ED, call 911, or call MD for any changes in condition.   Patient to be escorted via WC, and D/C home via private auto.

## 2016-06-17 NOTE — Progress Notes (Signed)
Progress Note   Subjective  Chief Complaint: Abnormal Ct scan, Abdominal Pain, Change in bowel habits  This morning pt is found in bed asleep. She tells me that she has had a lot of improvement since starting IV abx, her pain has decreased and feels more like a "crampy I need to go to the bathroom pain" instead of an all over "hurt". She did have a small BM with the help of an enema and 2 senokot as well as miralax this morning. Overall she is feeling better even though she is still in some pain and feels as though she still needs to pass more stool. Patient tells me that if there are no plans for further intervention at this time she would like to go home and be on abx.  She does have appt in our office scheduled with Dr. Adela Lank on 07/16/16.   Objective   Vital signs in last 24 hours: Temp:  [98.6 F (37 C)-98.7 F (37.1 C)] 98.6 F (37 C) (04/16 0603) Pulse Rate:  [68-77] 77 (04/16 0603) Resp:  [17-18] 18 (04/16 0603) BP: (105-110)/(58-68) 110/68 (04/16 0603) SpO2:  [97 %-98 %] 98 % (04/16 0603) Last BM Date: 06/14/16 General: Caucasian female in NAD Heart:  Regular rate and rhythm; no murmurs Lungs: Respirations even and unlabored, lungs CTA bilaterally Abdomen:  Soft, mild ttp worse in LLQ and nondistended.hyperactive BS all four quadrants Extremities:  Without edema. Neurologic:  Alert and oriented,  grossly normal neurologically. Psych:  Cooperative. Normal mood and affect.  Intake/Output from previous day: 04/15 0701 - 04/16 0700 In: 666 [P.O.:666] Out: -   Lab Results:  Recent Labs  06/15/16 2010 06/17/16 0429  WBC 18.4* 11.1*  HGB 14.8 11.8*  HCT 41.8 35.9*  PLT 310 257   BMET  Recent Labs  06/15/16 2010 06/17/16 0429  NA 136 138  K 3.4* 3.3*  CL 100* 107  CO2 24 24  GLUCOSE 98 83  BUN 12 7  CREATININE 0.70 0.68  CALCIUM 9.6 8.5*   LFT  Recent Labs  06/15/16 2010  PROT 8.1  ALBUMIN 4.3  AST 22  ALT 21  ALKPHOS 87  BILITOT 0.4    Studies/Results: Ct Abdomen Pelvis W Contrast  Result Date: 06/15/2016 CLINICAL DATA:  Left lower quadrant abdominal pain for 3 days. History of partial colectomy for diverticulitis in 2011. EXAM: CT ABDOMEN AND PELVIS WITH CONTRAST TECHNIQUE: Multidetector CT imaging of the abdomen and pelvis was performed using the standard protocol following bolus administration of intravenous contrast. CONTRAST:  ISOVUE-300 IOPAMIDOL (ISOVUE-300) INJECTION 61% COMPARISON:  None. FINDINGS: Lower chest: Hypoventilatory changes in the dependent lung bases. Otherwise no acute abnormality. Hepatobiliary: Diffuse hepatic steatosis. No liver mass. No liver surface irregularity. Normal gallbladder with no radiopaque cholelithiasis. No biliary ductal dilatation. Pancreas: Normal, with no mass or duct dilation. Spleen: Normal size. No mass. Adrenals/Urinary Tract: Normal adrenals. Normal kidneys with no hydronephrosis and no renal mass. Normal bladder. Stomach/Bowel: Grossly normal stomach. Normal caliber small bowel with no small bowel wall thickening. Appendix not discretely visualized. No pericecal inflammatory changes. Status post partial sigmoid colectomy with distal colonic anastomosis in the left lower abdomen. There is diffuse mild diverticulosis throughout the remnant large-bowel. There is a large volume of stool throughout the colon, most prominent in the region of the distal colonic anastomosis with prominent distention of the large bowel by stool in this location. There is focal circumferential colonic wall thickening with associated pericolonic fat stranding in  the distended portion of the sigmoid colon just distal to the colonic anastomosis (series 2/image 61). The sigmoid colon and rectum are relatively collapsed distal to the site of colonic wall thickening. No pneumatosis. No pericolonic free air. Vascular/Lymphatic: Normal caliber abdominal aorta. Patent portal, splenic, hepatic and renal veins. No  pathologically enlarged lymph nodes in the abdomen or pelvis. Reproductive: Grossly normal anteverted uterus. Right ovarian corpus luteum. No adnexal masses. Other: No pneumoperitoneum, ascites or focal fluid collection. Musculoskeletal: No aggressive appearing focal osseous lesions. Mild lower thoracic spondylosis. IMPRESSION: 1. Nonspecific focal colitis in the sigmoid colon just distal to the colonic anastomosis in the left lower abdomen. Although this may represent acute diverticulitis given underlying diffuse colonic diverticulosis, this is not certain and other infectious or inflammatory etiologies should be considered. 2. Large colonic stool volume, most prominent in the region of the distal colonic anastomosis, with apparent focal caliber transition just distal to the site of sigmoid colon wall thickening. An underlying peri-anastomotic colonic stricture is difficult to exclude. No pneumatosis or pericolonic free air. Surgical consultation advised. 3. Diffuse hepatic steatosis. Electronically Signed   By: Delbert Phenix M.D.   On: 06/15/2016 22:13    Assessment / Plan:   Assessment: 1. LLQ Abd Pain/ Abnormal CT: ? Focal colitis below anastomosis with heavy stool burden and ? Stricture-pt improving on IV abx, leukocytosis improved; likely this represented acute colitis/diverticulitis 2. Hepatic steatosis 3. H/o partial sigmoidectomy for h/o diverticular disease 6 yrs ago  Plan: 1. Patients leukocytosis and symptoms have improved overnight on IV abx-if she continues to do well on abx, likely we can hold on further eval of abnormal CT for now until acute inflammation resolved-would likely benefit from flex sig in the future; ? Necessity of possible surgical consult 2. Continue supportive measures, discussed that she should try increasing Miralax up to 4 times a day and try to stay away from Senokot as this could be producing her current abdominal cramping symptoms 3. Please await any further  recommendations from Dr. Myrtie Neither.  Thank you for your kind consultation, we will continue to follow.   LOS: 2 days   Unk Lightning  06/17/2016, 10:48 AM  Pager # [phone]  I have discussed the case with the PA, and that is the plan I formulated. I personally interviewed and examined the patient.  CC:  LLQ pain  She is feeling much better on Abx and after multiple BMs today.  I personally reviewed CT images.  I think she may have an anastomotic stricture but then got acute diverticulitis, which caused swelling and obstruction.  She can go home (I discussed with hospitalist) on 10 more days of cipro and flagyl as well as prn bentyl.  Miralax twice daily for the next week as well.  We will arrange follow up in clinic and determine timing of colonoscopy.    Charlie Pitter III Pager [phone]  Mon-Fri 8a-5p [phone] after 5p, weekends, holidays

## 2016-06-17 NOTE — Discharge Summary (Signed)
Physician Discharge Summary   Patient ID: Nichole Mcintosh MRN: [id number] DOB/AGE: May 06, 1985 31 y.o.  Admit date: 06/15/2016 Discharge date: 06/17/2016  Primary Care Physician:  Sharlene Dory, DO  Discharge Diagnoses:    . acute diverticulitis  . Essential hypertension . GAD (generalized anxiety disorder) . Hypokalemia   Constipation    Consults:  gastroentrology  Recommendations for Outpatient Follow-up:   1. Please repeat CBC/BMET at next visit    DIET: soft diet     Allergies:  No Known Allergies   DISCHARGE MEDICATIONS: Current Discharge Medication List    START taking these medications   Details  dicyclomine (BENTYL) 20 MG tablet Take 1 tablet (20 mg total) by mouth 3 (three) times daily as needed for spasms. Qty: 90 tablet, Refills: 1    ondansetron (ZOFRAN ODT) 4 MG disintegrating tablet Take 1 tablet (4 mg total) by mouth every 8 (eight) hours as needed for nausea or vomiting. Qty: 30 tablet, Refills: 0    oxyCODONE (OXY IR/ROXICODONE) 5 MG immediate release tablet Take 1 tablet (5 mg total) by mouth every 6 (six) hours as needed for severe pain. Qty: 15 tablet, Refills: 0    polyethylene glycol (MIRALAX / GLYCOLAX) packet Take 17 g by mouth 2 (two) times daily. Can use upto 4 times a day if needed for constipation Qty: 100 each, Refills: 2      CONTINUE these medications which have CHANGED   Details  ciprofloxacin (CIPRO) 500 MG tablet Take 1 tablet (500 mg total) by mouth 2 (two) times daily. X 10 days Qty: 20 tablet, Refills: 0    metroNIDAZOLE (FLAGYL) 500 MG tablet Take 1 tablet (500 mg total) by mouth 3 (three) times daily. X 10 days Qty: 30 tablet, Refills: 0      CONTINUE these medications which have NOT CHANGED   Details  amLODipine (NORVASC) 10 MG tablet Take 1 tablet (10 mg total) by mouth daily. Qty: 90 tablet, Refills: 1   Associated Diagnoses: Essential hypertension    amphetamine-dextroamphetamine (ADDERALL XR) 30 MG 24  hr capsule Take 1 capsule (30 mg total) by mouth daily. Qty: 30 capsule, Refills: 0   Associated Diagnoses: Attention deficit hyperactivity disorder (ADHD), predominantly inattentive type    cetirizine (ZYRTEC) 10 MG tablet Take 10 mg by mouth daily.    metoprolol succinate (TOPROL-XL) 50 MG 24 hr tablet Take 1 tablet (50 mg total) by mouth daily. Take with or immediately following a meal. Qty: 90 tablet, Refills: 1   Associated Diagnoses: Essential hypertension    venlafaxine XR (EFFEXOR-XR) 37.5 MG 24 hr capsule Take 1 capsule (37.5 mg total) by mouth daily with breakfast. Qty: 30 capsule, Refills: 1         Brief H and P: For complete details please refer to admission H and P, but in brief Patient is a 31 year old female with history of recurrent diverticulitis requiring partial colectomy in 2012, ADHD on Adderall and hypertension presented with 3 days of progressively worsening abdominal pain. Patient was placed on oral ciprofloxacin and Flagyl by her PCP however with no significant improvement. Patient presented to ED. CT of the abdomen and pelvis showed postsurgical changes, large amount of stool in the colon, diverticulosis and stranding around the sigmoid colon consistent with diverticulitis , ? Anastomotic stricture. Case was discussed with general surgery who recommended outpatient surgery follow-up.   Hospital Course:   Acute Diverticulitis: - improving, patient was placed on clear liquid diets, IV fluid hydration, IV ciprofloxacin and Flagyl. -Continue  MiraLAX for constipation - GI was consulted, recommended miralax BID, bentyl and antibiotics for 10days   - now tolerating soft diet, transitioned to oral cipro and flagyl for 10days for discharge - She has an appointment with gastroenterology, Dr. Adela Lank on 5/15. May need outpatient flex sig or colonoscopy to assess the possible stricture seen on CT scan.    Hypertension: -Continue metoprolol, amlodipine  ADHD,  depression -Continue venlafaxine, adderall   Hypokalemia: Replaced   Day of Discharge BP 112/69 (BP Location: Right Arm)   Pulse 77   Temp 98.8 F (37.1 C) (Oral)   Resp 18   Ht  (1.626 m)   Wt 72.5 kg (159 lb 13.3 oz)   LMP 05/26/2016   SpO2 99%   BMI 27.44 kg/m   Physical Exam: General: Alert and awake oriented x3 not in any acute distress. HEENT: anicteric sclera, pupils reactive to light and accommodation CVS: S1-S2 clear no murmur rubs or gallops Chest: clear to auscultation bilaterally, no wheezing rales or rhonchi Abdomen: soft nontender, nondistended, normal bowel sounds Extremities: no cyanosis, clubbing or edema noted bilaterally Neuro: Cranial nerves II-XII intact, no focal neurological deficits   The results of significant diagnostics from this hospitalization (including imaging, microbiology, ancillary and laboratory) are listed below for reference.    LAB RESULTS: Basic Metabolic Panel:  Recent Labs Lab 06/15/16 2010 06/17/16 0429  NA 136 138  K 3.4* 3.3*  CL 100* 107  CO2 24 24  GLUCOSE 98 83  BUN 12 7  CREATININE 0.70 0.68  CALCIUM 9.6 8.5*   Liver Function Tests:  Recent Labs Lab 06/15/16 2010  AST 22  ALT 21  ALKPHOS 87  BILITOT 0.4  PROT 8.1  ALBUMIN 4.3    Recent Labs Lab 06/15/16 2010  LIPASE 16   No results for input(s): AMMONIA in the last 168 hours. CBC:  Recent Labs Lab 06/15/16 2010 06/17/16 0429  WBC 18.4* 11.1*  NEUTROABS 14.7*  --   HGB 14.8 11.8*  HCT 41.8 35.9*  MCV 88.0 89.5  PLT 310 257   Cardiac Enzymes: No results for input(s): CKTOTAL, CKMB, CKMBINDEX, TROPONINI in the last 168 hours. BNP: Invalid input(s): POCBNP CBG: No results for input(s): GLUCAP in the last 168 hours.  Significant Diagnostic Studies:  Ct Abdomen Pelvis W Contrast  Result Date: 06/15/2016 CLINICAL DATA:  Left lower quadrant abdominal pain for 3 days. History of partial colectomy for diverticulitis in 2011. EXAM: CT  ABDOMEN AND PELVIS WITH CONTRAST TECHNIQUE: Multidetector CT imaging of the abdomen and pelvis was performed using the standard protocol following bolus administration of intravenous contrast. CONTRAST:  ISOVUE-300 IOPAMIDOL (ISOVUE-300) INJECTION 61% COMPARISON:  None. FINDINGS: Lower chest: Hypoventilatory changes in the dependent lung bases. Otherwise no acute abnormality. Hepatobiliary: Diffuse hepatic steatosis. No liver mass. No liver surface irregularity. Normal gallbladder with no radiopaque cholelithiasis. No biliary ductal dilatation. Pancreas: Normal, with no mass or duct dilation. Spleen: Normal size. No mass. Adrenals/Urinary Tract: Normal adrenals. Normal kidneys with no hydronephrosis and no renal mass. Normal bladder. Stomach/Bowel: Grossly normal stomach. Normal caliber small bowel with no small bowel wall thickening. Appendix not discretely visualized. No pericecal inflammatory changes. Status post partial sigmoid colectomy with distal colonic anastomosis in the left lower abdomen. There is diffuse mild diverticulosis throughout the remnant large-bowel. There is a large volume of stool throughout the colon, most prominent in the region of the distal colonic anastomosis with prominent distention of the large bowel by stool in this location.  There is focal circumferential colonic wall thickening with associated pericolonic fat stranding in the distended portion of the sigmoid colon just distal to the colonic anastomosis (series 2/image 61). The sigmoid colon and rectum are relatively collapsed distal to the site of colonic wall thickening. No pneumatosis. No pericolonic free air. Vascular/Lymphatic: Normal caliber abdominal aorta. Patent portal, splenic, hepatic and renal veins. No pathologically enlarged lymph nodes in the abdomen or pelvis. Reproductive: Grossly normal anteverted uterus. Right ovarian corpus luteum. No adnexal masses. Other: No pneumoperitoneum, ascites or focal fluid  collection. Musculoskeletal: No aggressive appearing focal osseous lesions. Mild lower thoracic spondylosis. IMPRESSION: 1. Nonspecific focal colitis in the sigmoid colon just distal to the colonic anastomosis in the left lower abdomen. Although this may represent acute diverticulitis given underlying diffuse colonic diverticulosis, this is not certain and other infectious or inflammatory etiologies should be considered. 2. Large colonic stool volume, most prominent in the region of the distal colonic anastomosis, with apparent focal caliber transition just distal to the site of sigmoid colon wall thickening. An underlying peri-anastomotic colonic stricture is difficult to exclude. No pneumatosis or pericolonic free air. Surgical consultation advised. 3. Diffuse hepatic steatosis. Electronically Signed   By: Delbert Phenix M.D.   On: 06/15/2016 22:13    2D ECHO:   Disposition and Follow-up: Discharge Instructions    AMB Referral to [facility] Care Management    Complete by:  As directed    Please assign [employer] member for post discharge call. Denies having any [facility] to [facility] needs. Currently at [facility]. Please call with questions. Thanks. Raiford Noble, MSN-Ed, RN,BSN [facility] Liaison-[phone]   Reason for consult:  Please assign UMR member for post discharge call.   Expected date of contact:  1-3 days (reserved for hospital discharges)   Diet - low sodium heart healthy    Complete by:  As directed    Discharge instructions    Complete by:  As directed    SOFT DIET, miralax twice a day for atleast a week. Please finish cipro and flagyl for 10days, Stay hydrated.   Increase activity slowly    Complete by:  As directed        DISPOSITION: home    DISCHARGE FOLLOW-UP Follow-up Information    Jilda Roche Wendling, DO. Schedule an appointment as soon as possible for a visit in 2 week(s).   Specialty:  Family Medicine Contact information: [address] [place] [address] [phone]        Ruffin Frederick, MD Follow up on 07/16/2016.   Specialty:  Gastroenterology Why:  at 2:30 PM  Contact information: [address] [place] [address] [phone]            Time spent on Discharge:   Signed:   Thad Ranger M.D. [facility] 06/17/2016, 6:23 PM Pager: [phone]

## 2016-06-18 ENCOUNTER — Telehealth: Payer: Self-pay

## 2016-06-18 NOTE — Telephone Encounter (Signed)
06/18/16  Transition Care Management Follow-up Telephone Call  ADMISSION DATE: 06/15/16  DISCHARGE DATE: 06/17/16   How have you been since you were released from the hospital?  Patient states she has been doing ok.  Do you understand why you were in the hospital? YES   Do you understand the discharge instrcutions? Yes  Items Reviewed:  Medications reviewed:  Medications reviewed with patient.    Allergies reviewed: NKDA  Dietary changes reviewed:Soft  Referrals reviewed: Dr. Carmelia Roller- Scheduled  [facility]-Scheduled    Functional Questionnaire:   Activities of Daily Living (ADLs):  No help needed at this time         DO you drive: Yes but not at this time    Any transportation issues/concerns: Patient states she cannot drive at this time     Any patient concerns? No   Confirmed importance and date/time of follow-up visits scheduled: Yes   Confirmed with patient if condition begins to worsen call PCP or go to the ER. Yes    Patient was given the Call-a-Nurse line [phone]: Yes

## 2016-06-18 NOTE — Telephone Encounter (Signed)
-----   Message from Unk Lightning, [place] sent at 06/18/2016  8:48 AM EDT ----- Regarding: please set up appt Can you set appt for this pt in 2 weeks with me-for hosp followup diverticulitis. Thanks-JLL  ----- Message ----- From: Sherrilyn Rist, MD Sent: 06/17/2016   6:20 PM To: Unk Lightning, PA  This patient went home this evening on Abx.  Please see her in clinic in about 2 weeks and we will discuss timing of a colonoscopy to follow shortly after.  Then take her off [facility] clinic schedule next month since I will pick up this case.  - HD

## 2016-06-18 NOTE — Telephone Encounter (Signed)
Pre visit call completed. Follow up appointment scheduled.

## 2016-06-19 ENCOUNTER — Other Ambulatory Visit: Payer: Self-pay | Admitting: *Deleted

## 2016-06-19 NOTE — Patient Outreach (Signed)
[facility]) Care Management  06/19/2016  Nichole Mcintosh 15-Jun-1985 [id number]   Subjective: Telephone call to patient's home  / mobile number, no answer, left HIPAA compliant voicemail message, and requested call back.   Objective: Per chart review, patient hospitalized 06/15/16 -06/17/16 for acute diverticulitis.   Patient also has a history of hypertension and GAD (generalized anxiety disorder).     Assessment: Received [facility] Transition of care referral on 06/18/16.  Transition of care follow up pending patient contact.   Plan: RNCM will call patient for 2nd telephone outreach attempt, transition of care follow up, within 10 business days if no return call.   Fawaz Borquez H. Gardiner Barefoot, BSN, CCM [facility] Care Management [facility] Telephonic CM Phone: [phone] Fax: [phone]

## 2016-06-19 NOTE — Telephone Encounter (Signed)
You can go ahead and schedule her with Dr. Dawna Part can you please give her a call at the end of next week to assess how she is doing, I don't want to lose track of her. Thanks-JLL

## 2016-06-19 NOTE — Telephone Encounter (Signed)
Appt made for 07/12/16 830 am with Dr Myrtie Neither.  Left message on machine to call back

## 2016-06-19 NOTE — Telephone Encounter (Signed)
Only a few days sooner 07/12/16.

## 2016-06-19 NOTE — Telephone Encounter (Signed)
Your first available is 07/16/16 is this ok?

## 2016-06-19 NOTE — Telephone Encounter (Signed)
Does Dr. Myrtie Neither have anything sooner?

## 2016-06-20 ENCOUNTER — Other Ambulatory Visit: Payer: Self-pay | Admitting: *Deleted

## 2016-06-20 ENCOUNTER — Encounter: Payer: Self-pay | Admitting: *Deleted

## 2016-06-20 NOTE — Telephone Encounter (Signed)
I spoke with the pt and she has been advised of the appt with Dr Myrtie Neither.  The pt states that the pain and cramps have resolved and she feels "much better"

## 2016-06-20 NOTE — Patient Outreach (Signed)
[facility]) Care Management  06/20/2016  Nichole Mcintosh December 13, 1985 [id number]  Subjective: Telephone call to patient's home / mobile number, spoke with patient, and HIPAA verified.  Discussed [facility] Care Management [facility] Transition of care follow up, patient voiced understanding, and is in agreement to follow up.   Patient states the pain is getting better, getting back on track with her activities of daily living, still having fatigue, and working on increasing her activity / strength.   States she has scheduled her follow up appointments with primary MD for 07/03/16 and gastroenterologist for  07/12/16.  Patient states she is not currently eligible for family medical leave act due to not enough service time with [name] and is working on requesting medical leave.   States she is scheduled to work on 5//11/18, will contact to her supervisor regarding taking pal [name]), or leaving work for the appointment if needed prior to rescheduling the appointment.  Patient states she is needing  assistance with new diet plan related to diverticulitis and wants to see nutritionist.  RNCM advised patient of the [employer] benefit at [name], given contact number [phone]) to make an appointment, patient voices understanding, and states she will follow up.   Patient states she does not have any transition of care, care coordination, disease management, disease monitoring, transportation, community resource, or pharmacy needs at this time.  States she is very appreciative of the follow up and is in agreement to receive [facility] Care Management information.   Objective: Per chart review, patient hospitalized 06/15/16 -06/17/16 for acute diverticulitis.   Patient also has a history of hypertension and GAD (generalized anxiety disorder).     Assessment: Received [facility] Transition of care referral on 06/18/16. Transition of care  follow up completed, no care management needs, and will proceed with case closure.   Plan: RNCM will send patient successful outreach letter, [facility] pamphlet, and magnet. RNCM will send case closure due to follow up completed / no care management needs request to [name] at [facility] Care Management.   Olanrewaju Osborn H. Gardiner Barefoot, BSN, CCM [facility] Care Management [facility] Telephonic CM Phone: [phone] Fax: [phone]

## 2016-06-22 ENCOUNTER — Encounter: Payer: Self-pay | Admitting: Gastroenterology

## 2016-06-27 ENCOUNTER — Telehealth: Payer: Self-pay | Admitting: *Deleted

## 2016-06-27 NOTE — Telephone Encounter (Signed)
Received completed and signed FMLA paperwork from Dr. Carmelia Roller on (06/24/16).  All paperwork was faxed to [employer] on (06/24/16).  Confirmation received.  Original sent for scanning.//AB/CMA

## 2016-07-03 ENCOUNTER — Ambulatory Visit (INDEPENDENT_AMBULATORY_CARE_PROVIDER_SITE_OTHER): Payer: 59 | Admitting: Family Medicine

## 2016-07-03 ENCOUNTER — Encounter: Payer: Self-pay | Admitting: Family Medicine

## 2016-07-03 ENCOUNTER — Inpatient Hospital Stay: Payer: 59 | Admitting: Family Medicine

## 2016-07-03 VITALS — BP 120/70 | HR 95 | Temp 98.1°F | Ht 64.0 in | Wt 157.8 lb

## 2016-07-03 DIAGNOSIS — Z09 Encounter for follow-up examination after completed treatment for conditions other than malignant neoplasm: Secondary | ICD-10-CM

## 2016-07-03 DIAGNOSIS — Z8719 Personal history of other diseases of the digestive system: Secondary | ICD-10-CM | POA: Diagnosis not present

## 2016-07-03 DIAGNOSIS — Z720 Tobacco use: Secondary | ICD-10-CM | POA: Diagnosis not present

## 2016-07-03 LAB — CBC
HCT: 40.8 % (ref 36.0–46.0)
Hemoglobin: 14 g/dL (ref 12.0–15.0)
MCHC: 34.2 g/dL (ref 30.0–36.0)
MCV: 91 fl (ref 78.0–100.0)
PLATELETS: 402 10*3/uL — AB (ref 150.0–400.0)
RBC: 4.49 Mil/uL (ref 3.87–5.11)
RDW: 13.7 % (ref 11.5–15.5)
WBC: 8.3 10*3/uL (ref 4.0–10.5)

## 2016-07-03 LAB — BASIC METABOLIC PANEL
BUN: 11 mg/dL (ref 6–23)
CHLORIDE: 106 meq/L (ref 96–112)
CO2: 27 mEq/L (ref 19–32)
Calcium: 9.6 mg/dL (ref 8.4–10.5)
Creatinine, Ser: 0.66 mg/dL (ref 0.40–1.20)
GFR: 109.45 mL/min (ref >60.00)
Glucose, Bld: 100 mg/dL — ABNORMAL HIGH (ref 70–99)
POTASSIUM: 3.6 meq/L (ref 3.5–5.1)
SODIUM: 138 meq/L (ref 135–145)

## 2016-07-03 MED ORDER — CIPROFLOXACIN HCL 500 MG PO TABS: 500.0000 mg | ORAL_TABLET | Freq: Two times a day (BID) | ORAL | 0 refills | Status: DC

## 2016-07-03 MED ORDER — NICOTINE 21 MG/24HR TD PT24: 21.0000 mg | MEDICATED_PATCH | Freq: Every day | TRANSDERMAL | 0 refills | Status: DC

## 2016-07-03 MED ORDER — METRONIDAZOLE 500 MG PO TABS: 500.0000 mg | ORAL_TABLET | Freq: Three times a day (TID) | ORAL | 0 refills | Status: DC

## 2016-07-03 MED FILL — ADDERALL XR 30 MG CAP SA: 30 | 30 days supply | Qty: 30 | Fill #0

## 2016-07-03 NOTE — Progress Notes (Signed)
Pre visit review using our clinic review tool, if applicable. No additional management support is needed unless otherwise documented below in the visit note. 

## 2016-07-03 NOTE — Patient Instructions (Addendum)
6 weeks- 1 patch per day Next 2 weeks-2/3 patch Last 2 weeks-1/3 patch  Let us know if you need anything.

## 2016-07-03 NOTE — Progress Notes (Signed)
Chief Complaint  Patient presents with  . Hospitalization Follow-up    for diverticulitis-pt states doing better    Subjective: Patient is a 31 y.o. female here for Hospital follow-up and medication review.  The patient was admitted to [facility] on 06/15/16 and discharged on 06/17/16. She is admitted for colitis/diverticulitis. Colonoscopy while inpatient did show inflammation, placing Crohn's disease on the differential diagnosis. She does have follow-up with gastroenterology in 07/12/16. She feels improved overall. Her stools have returned to normal and she is not painful. Diet is normal.  She is also relatively new on venlafaxine for her anxiety. She feels that this is much better than the sertraline and fluoxetine. No side effects. She does not wish to change the dose. She is not following with a counselor or psychologist. No homicidal or suicidal ideation.  The patient also has a history of ADHD. For a while, the Adderall she is rx'd was not helpful. When she started on treatment for anxiety, she felt her symptoms start to be well controlled again. Denies any weight loss, facial tics, or palpitations.   ROS: GI: No current abd pain Psych: as noted in HPI  Family History  Problem Relation Age of Onset  . Hypertension Mother   . Heart disease Maternal Grandmother   . Cancer Maternal Grandmother     Lung  . Heart disease Maternal Grandfather    Past Medical History:  Diagnosis Date  . Attention deficit hyperactivity disorder (ADHD) 11/03/2015  . Colitis   . Diverticulitis   . Hypertension    No Known Allergies  Current Outpatient Prescriptions:  .  amLODipine (NORVASC) 10 MG tablet, Take 1 tablet (10 mg total) by mouth daily., Disp: 90 tablet, Rfl: 1 .  amphetamine-dextroamphetamine (ADDERALL XR) 30 MG 24 hr capsule, Take 1 capsule (30 mg total) by mouth daily., Disp: 30 capsule, Rfl: 0 .  cetirizine (ZYRTEC) 10 MG tablet, Take 10 mg by mouth daily., Disp: , Rfl:  .   dicyclomine (BENTYL) 20 MG tablet, Take 1 tablet (20 mg total) by mouth 3 (three) times daily as needed for spasms., Disp: 90 tablet, Rfl: 1 .  metoprolol succinate (TOPROL-XL) 50 MG 24 hr tablet, Take 1 tablet (50 mg total) by mouth daily. Take with or immediately following a meal., Disp: 90 tablet, Rfl: 1 .  ondansetron (ZOFRAN ODT) 4 MG disintegrating tablet, Take 1 tablet (4 mg total) by mouth every 8 (eight) hours as needed for nausea or vomiting., Disp: 30 tablet, Rfl: 0 .  polyethylene glycol (MIRALAX / GLYCOLAX) packet, Take 17 g by mouth 2 (two) times daily. Can use upto 4 times a day if needed for constipation, Disp: 100 each, Rfl: 2 .  venlafaxine XR (EFFEXOR-XR) 37.5 MG 24 hr capsule, Take 1 capsule (37.5 mg total) by mouth daily with breakfast., Disp: 30 capsule, Rfl: 1 .  ciprofloxacin (CIPRO) 500 MG tablet, Take 1 tablet (500 mg total) by mouth 2 (two) times daily. X 10 days, Disp: 20 tablet, Rfl: 0 .  metroNIDAZOLE (FLAGYL) 500 MG tablet, Take 1 tablet (500 mg total) by mouth 3 (three) times daily. X 10 days, Disp: 30 tablet, Rfl: 0 .  nicotine (NICODERM CQ - DOSED IN MG/24 HOURS) 21 mg/24hr patch, Place 1 patch (21 mg total) onto the skin daily., Disp: 56 patch, Rfl: 0  Objective: BP 120/70 (BP Location: Left Arm, Patient Position: Sitting, Cuff Size: Normal)   Pulse 95   Temp 98.1 F (36.7 C) (Oral)   Ht   (1.626 m)   Wt 157 lb 12.8 oz (71.6 kg)   LMP 07/01/2016   SpO2 98%   BMI 27.09 kg/m  General: Awake, appears stated age HEENT: MMM, EOMi Heart: RRR, no murmurs Lungs: CTAB, no rales, wheezes or rhonchi. No accessory muscle use Abd: BS+, soft, TTP diffusely, worse in LLQ, ND, no masses or organomegaly Psych: Age appropriate judgment and insight, normal affect and mood  Assessment and Plan: Hospital discharge follow-up - Plan: CBC, Basic metabolic panel  Tobacco abuse - Plan: nicotine (NICODERM CQ - DOSED IN MG/24 HOURS) 21 mg/24hr patch  History of  diverticulitis - Plan: metroNIDAZOLE (FLAGYL) 500 MG tablet, ciprofloxacin (CIPRO) 500 MG tablet  Orders as above. Has appt on 07/12/16 with GI. More tender than either of [place] anticipated on exam. Printed out paperwork prescriptions for metronidazole and ciprofloxacin should she start having symptoms suggestive of diverticulitis again. Counseled on smoking cessation. She is in the contemplative phase and which is to have patches. 56 of the 21 mg patches were given, she will cut them into thirds for the last 4 weeks of treatment. 2/3 in 2 weeks after 6 weeks of 21 mg and then 1/2 of a patch for the final 2 weeks. This was written down for her. I will see her again in 6 months for anxiety/ADHD. Sooner if she needs [place]. The patient voiced understanding and agreement to the plan.  Jilda Roche [place], DO 07/03/16  12:02 PM

## 2016-07-05 MED FILL — VENLAFAXINE HCL ER 37.5 MG: 37.5 | 30 days supply | Qty: 30 | Fill #0

## 2016-07-12 ENCOUNTER — Ambulatory Visit (INDEPENDENT_AMBULATORY_CARE_PROVIDER_SITE_OTHER): Payer: 59 | Admitting: Gastroenterology

## 2016-07-12 ENCOUNTER — Encounter: Payer: Self-pay | Admitting: Gastroenterology

## 2016-07-12 VITALS — BP 124/80 | HR 100 | Ht 63.0 in | Wt 161.0 lb

## 2016-07-12 DIAGNOSIS — R12 Heartburn: Secondary | ICD-10-CM | POA: Diagnosis not present

## 2016-07-12 DIAGNOSIS — K5900 Constipation, unspecified: Secondary | ICD-10-CM

## 2016-07-12 DIAGNOSIS — R1032 Left lower quadrant pain: Secondary | ICD-10-CM | POA: Diagnosis not present

## 2016-07-12 DIAGNOSIS — K5792 Diverticulitis of intestine, part unspecified, without perforation or abscess without bleeding: Secondary | ICD-10-CM

## 2016-07-12 MED ORDER — OMEPRAZOLE 40 MG PO CPDR: 40.0000 mg | DELAYED_RELEASE_CAPSULE | Freq: Every day | ORAL | 2 refills | Status: DC

## 2016-07-12 MED ORDER — NA SULFATE-K SULFATE-MG SULF 17.5-3.13-1.6 GM/177ML PO SOLN: 1.0000 | Freq: Once | ORAL | 0 refills | Status: AC

## 2016-07-12 NOTE — Progress Notes (Signed)
     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 She had previous sigmoid resection about 7 years ago for diverticulitis with complications. On this occasion, CT scan indicated double diverticulitis with some surrounding stranding of the sigmoid colon near the anastomosis. Reviewing imaging indicates significant retained stool with some mild proximal dilatation. She received IV antibiotics, and then went home for total 10 day course of treatment with oral meds. She was also given as needed Bentyl and MiraLAX, though she stopped the MiraLAX because she felt it was "too strong". We'll softeners have been sufficient keep her bowels moving regularly in the last week or 2. She still has some intermittent left lower quadrant pain that improves with dicyclomine. There's been no rectal bleeding, her appetite is good and her weight stable. She is also bothered by frequent pyrosis that seems worse in the last 3-4 weeks. There is no dysphagia, odynophagia, vomiting or weight loss. She continues to smoke, but says she is trying to quit. She was counseled regarding the need for smoking cessation.  ROS: Cardiovascular:  no chest pain Respiratory: no dyspnea  The patient's Past Medical, Family and Social History were reviewed and are on file in the EMR.  Objective:  Med list reviewed  Vital signs in last 24 hrs: Vitals:   07/12/16 0847  BP: 124/80  Pulse: 100    Physical Exam  She is well-appearing and in no acute distress  HEENT: sclera anicteric, oral mucosa moist without lesions  Neck: supple, no thyromegaly, JVD or lymphadenopathy  Cardiac: RRR without murmurs, S1S2 heard, no peripheral edema  Pulm: clear to auscultation bilaterally, normal RR and effort noted  Abdomen: soft,  minimal LLQ tenderness, with active bowel sounds. No guarding or palpable hepatosplenomegaly.  Skin; warm and dry, no jaundice or rash   Radiologic studies: CT abdomen and pelvis images were personally reviewed   @ASSESSMENTPLANBEGIN @ Assessment: Encounter Diagnoses  Name Primary?  . Acute diverticulitis Yes  . LLQ abdominal pain   . Constipation, unspecified constipation type   . Heartburn     I think the infectious component is resolved after antibiotics, there may still be some persistent edema. It is possible she has an anastomotic stricture contributing to the constipation.  Plan:  Omeprazole 40 mg once daily for reflux. She needs to quit smoking as well. Colonoscopy in the hospital outpatient Endo lab in a few weeks, perform dilatation as needed for anastomotic stricture. Continue stool softeners  Total time 25 minutes, over half spent in counseling and coordination of care.   Charlie PitterHenry L Danis III

## 2016-07-12 NOTE — Patient Instructions (Signed)
If you are age 465 or older, your body mass index should be between 23-30. Your Body mass index is 28.52 kg/m. If this is out of the aforementioned range listed, please consider follow up with your Primary Care Provider.  If you are age 33 or younger, your body mass index should be between 19-25. Your Body mass index is 28.52 kg/m. If this is out of the aformentioned range listed, please consider follow up with your Primary Care Provider.   You have been scheduled for a colonoscopy. Please follow written instructions given to you at your visit today.  Please pick up your prep supplies at the pharmacy within the next 1-3 days. If you use inhalers (even only as needed), please bring them with you on the day of your procedure.  Thank you for choosing [facility]  Dr Amada JupiterHenry Danis III

## 2016-07-16 ENCOUNTER — Ambulatory Visit: Payer: 59 | Admitting: Gastroenterology

## 2016-07-31 ENCOUNTER — Encounter (HOSPITAL_COMMUNITY): Payer: Self-pay | Admitting: *Deleted

## 2016-08-01 ENCOUNTER — Ambulatory Visit (HOSPITAL_COMMUNITY): Payer: 59 | Admitting: Certified Registered Nurse Anesthetist

## 2016-08-01 ENCOUNTER — Ambulatory Visit (HOSPITAL_COMMUNITY)
Admission: RE | Admit: 2016-08-01 | Discharge: 2016-08-01 | Disposition: A | Discharge status: Home / Self Care | Payer: 59 | Source: Ambulatory Visit | Attending: Gastroenterology | Admitting: Gastroenterology

## 2016-08-01 ENCOUNTER — Encounter (HOSPITAL_COMMUNITY): Payer: Self-pay

## 2016-08-01 ENCOUNTER — Encounter (HOSPITAL_COMMUNITY)
Admission: RE | Disposition: A | Discharge status: Home / Self Care | Payer: Self-pay | Source: Ambulatory Visit | Attending: Gastroenterology

## 2016-08-01 DIAGNOSIS — F909 Attention-deficit hyperactivity disorder, unspecified type: Secondary | ICD-10-CM | POA: Diagnosis not present

## 2016-08-01 DIAGNOSIS — R1032 Left lower quadrant pain: Secondary | ICD-10-CM | POA: Diagnosis not present

## 2016-08-01 DIAGNOSIS — Z980 Intestinal bypass and anastomosis status: Secondary | ICD-10-CM | POA: Insufficient documentation

## 2016-08-01 DIAGNOSIS — K5900 Constipation, unspecified: Secondary | ICD-10-CM | POA: Diagnosis not present

## 2016-08-01 DIAGNOSIS — F329 Major depressive disorder, single episode, unspecified: Secondary | ICD-10-CM | POA: Diagnosis not present

## 2016-08-01 DIAGNOSIS — K5792 Diverticulitis of intestine, part unspecified, without perforation or abscess without bleeding: Secondary | ICD-10-CM | POA: Diagnosis not present

## 2016-08-01 DIAGNOSIS — K219 Gastro-esophageal reflux disease without esophagitis: Secondary | ICD-10-CM | POA: Insufficient documentation

## 2016-08-01 DIAGNOSIS — K562 Volvulus: Secondary | ICD-10-CM | POA: Insufficient documentation

## 2016-08-01 DIAGNOSIS — Z79899 Other long term (current) drug therapy: Secondary | ICD-10-CM | POA: Diagnosis not present

## 2016-08-01 DIAGNOSIS — K5732 Diverticulitis of large intestine without perforation or abscess without bleeding: Secondary | ICD-10-CM | POA: Insufficient documentation

## 2016-08-01 DIAGNOSIS — I10 Essential (primary) hypertension: Secondary | ICD-10-CM | POA: Diagnosis not present

## 2016-08-01 DIAGNOSIS — R12 Heartburn: Secondary | ICD-10-CM

## 2016-08-01 DIAGNOSIS — F17200 Nicotine dependence, unspecified, uncomplicated: Secondary | ICD-10-CM | POA: Diagnosis not present

## 2016-08-01 HISTORY — PX: COLONOSCOPY WITH PROPOFOL: SHX5780

## 2016-08-01 HISTORY — DX: Headache: R51

## 2016-08-01 HISTORY — DX: Nausea with vomiting, unspecified: R11.2

## 2016-08-01 HISTORY — DX: Gastro-esophageal reflux disease without esophagitis: K21.9

## 2016-08-01 HISTORY — DX: Major depressive disorder, single episode, unspecified: F32.9

## 2016-08-01 HISTORY — DX: Headache, unspecified: R51.9

## 2016-08-01 HISTORY — DX: Depression, unspecified: F32.A

## 2016-08-01 HISTORY — DX: Cardiac murmur, unspecified: R01.1

## 2016-08-01 HISTORY — DX: Other specified postprocedural states: Z98.890

## 2016-08-01 SURGERY — COLONOSCOPY WITH PROPOFOL
Anesthesia: Monitor Anesthesia Care

## 2016-08-01 MED ORDER — ESMOLOL HCL 100 MG/10ML IV SOLN
INTRAVENOUS | Status: AC
  Filled 2016-08-01: qty 10

## 2016-08-01 MED ORDER — PROPOFOL 10 MG/ML IV BOLUS
INTRAVENOUS | Status: AC
  Filled 2016-08-01: qty 40

## 2016-08-01 MED ORDER — SODIUM CHLORIDE 0.9 % IV SOLN: INTRAVENOUS | Status: DC

## 2016-08-01 MED ORDER — LIDOCAINE 2% (20 MG/ML) 5 ML SYRINGE
INTRAMUSCULAR | Status: AC
  Filled 2016-08-01: qty 5

## 2016-08-01 MED ORDER — PROPOFOL 10 MG/ML IV BOLUS
INTRAVENOUS | Status: AC
  Filled 2016-08-01: qty 20

## 2016-08-01 MED ORDER — PROPOFOL 10 MG/ML IV BOLUS
INTRAVENOUS | Status: DC | PRN
  Administered 2016-08-01: 20 mg via INTRAVENOUS
  Administered 2016-08-01: 30 mg via INTRAVENOUS
  Administered 2016-08-01: 20 mg via INTRAVENOUS
  Administered 2016-08-01: 50 mg via INTRAVENOUS

## 2016-08-01 MED ORDER — LIDOCAINE 2% (20 MG/ML) 5 ML SYRINGE
INTRAMUSCULAR | Status: DC | PRN
  Administered 2016-08-01: 100 mg via INTRAVENOUS

## 2016-08-01 MED ORDER — ESMOLOL HCL 100 MG/10ML IV SOLN
INTRAVENOUS | Status: DC | PRN
  Administered 2016-08-01: 10 mg via INTRAVENOUS

## 2016-08-01 MED ORDER — LACTATED RINGERS IV SOLN
INTRAVENOUS | Status: DC
  Administered 2016-08-01: 1000 mL via INTRAVENOUS

## 2016-08-01 MED ORDER — PROPOFOL 500 MG/50ML IV EMUL
INTRAVENOUS | Status: DC | PRN
  Administered 2016-08-01: 200 ug/kg/min via INTRAVENOUS

## 2016-08-01 SURGICAL SUPPLY — 21 items

## 2016-08-01 NOTE — Anesthesia Preprocedure Evaluation (Addendum)
Anesthesia Evaluation  Patient identified by MRN, date of birth, ID band Patient awake    Reviewed: Allergy & Precautions, NPO status , Patient's Chart, lab work & pertinent test results  History of Anesthesia Complications (+) PONV and history of anesthetic complications  Airway Mallampati: I  TM Distance: >3 FB Neck ROM: Full    Dental no notable dental hx.    Pulmonary Current Smoker,    Pulmonary exam normal breath sounds clear to auscultation       Cardiovascular hypertension, Pt. on medications and Pt. on home beta blockers Normal cardiovascular exam Rhythm:Regular Rate:Normal     Neuro/Psych Depression ADHD negative neurological ROS     GI/Hepatic Neg liver ROS, GERD  Medicated,  Endo/Other  negative endocrine ROS  Renal/GU negative Renal ROS  negative genitourinary   Musculoskeletal negative musculoskeletal ROS (+)   Abdominal   Peds negative pediatric ROS (+)  Hematology negative hematology ROS (+)   Anesthesia Other Findings   Reproductive/Obstetrics negative OB ROS                            Anesthesia Physical Anesthesia Plan  ASA: III  Anesthesia Plan: MAC   Post-op Pain Management:    Induction: Intravenous  Airway Management Planned: Nasal Cannula  Additional Equipment:   Intra-op Plan:   Post-operative Plan:   Informed Consent: I have reviewed the patients History and Physical, chart, labs and discussed the procedure including the risks, benefits and alternatives for the proposed anesthesia with the patient or authorized representative who has indicated his/her understanding and acceptance.   Dental advisory given  Plan Discussed with: CRNA  Anesthesia Plan Comments:        Anesthesia Quick Evaluation

## 2016-08-01 NOTE — H&P (View-Only) (Signed)
     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 She had previous sigmoid resection about 7 years ago for diverticulitis with complications. On this occasion, CT scan indicated double diverticulitis with some surrounding stranding of the sigmoid colon near the anastomosis. Reviewing imaging indicates significant retained stool with some mild proximal dilatation. She received IV antibiotics, and then went home for total 10 day course of treatment with oral meds. She was also given as needed Bentyl and MiraLAX, though she stopped the MiraLAX because she felt it was "too strong". We'll softeners have been sufficient keep her bowels moving regularly in the last week or 2. She still has some intermittent left lower quadrant pain that improves with dicyclomine. There's been no rectal bleeding, her appetite is good and her weight stable. She is also bothered by frequent pyrosis that seems worse in the last 3-4 weeks. There is no dysphagia, odynophagia, vomiting or weight loss. She continues to smoke, but says she is trying to quit. She was counseled regarding the need for smoking cessation.  ROS: Cardiovascular:  no chest pain Respiratory: no dyspnea  The patient's Past Medical, Family and Social History were reviewed and are on file in the EMR.  Objective:  Med list reviewed  Vital signs in last 24 hrs: Vitals:   07/12/16 0847  BP: 124/80  Pulse: 100    Physical Exam  She is well-appearing and in no acute distress  HEENT: sclera anicteric, oral mucosa moist without lesions  Neck: supple, no thyromegaly, JVD or lymphadenopathy  Cardiac: RRR without murmurs, S1S2 heard, no peripheral edema  Pulm: clear to auscultation bilaterally, normal RR and effort noted  Abdomen: soft,  minimal LLQ tenderness, with active bowel sounds. No guarding or palpable hepatosplenomegaly.  Skin; warm and dry, no jaundice or rash   Radiologic studies: CT abdomen and pelvis images were personally reviewed   @ASSESSMENTPLANBEGIN @ Assessment: Encounter Diagnoses  Name Primary?  . Acute diverticulitis Yes  . LLQ abdominal pain   . Constipation, unspecified constipation type   . Heartburn     I think the infectious component is resolved after antibiotics, there may still be some persistent edema. It is possible she has an anastomotic stricture contributing to the constipation.  Plan:  Omeprazole 40 mg once daily for reflux. She needs to quit smoking as well. Colonoscopy in the hospital outpatient Endo lab in a few weeks, perform dilatation as needed for anastomotic stricture. Continue stool softeners  Total time 25 minutes, over half spent in counseling and coordination of care.   Charlie PitterHenry L Danis III

## 2016-08-01 NOTE — Anesthesia Postprocedure Evaluation (Signed)
Anesthesia Post Note  Patient: Nichole Mcintosh  Procedure(s) Performed: Procedure(s) (LRB): COLONOSCOPY WITH PROPOFOL (N/A)     Patient location during evaluation: PACU Anesthesia Type: MAC Level of consciousness: awake and alert Pain management: pain level controlled Vital Signs Assessment: post-procedure vital signs reviewed and stable Respiratory status: spontaneous breathing, nonlabored ventilation, respiratory function stable and patient connected to nasal cannula oxygen Cardiovascular status: stable and blood pressure returned to baseline Anesthetic complications: no    Last Vitals:  Vitals:   08/01/16 1010 08/01/16 1020  BP: (!) 150/111 (!) 150/96  Pulse: 87 81  Resp: 17 18  Temp:      Last Pain:  Vitals:   08/01/16 0959  TempSrc: Oral                 Nichole Mcintosh

## 2016-08-01 NOTE — Op Note (Signed)
[facility] Patient Name: Nichole Mcintosh Procedure Date: 08/01/2016 MRN: [id number] Attending MD: Estill Cotta. Loletha Carrow , MD Date of Birth: 1985-10-11 CSN: [id number] Age: 31 Admit Type: Outpatient Procedure:                Colonoscopy Indications:              Abdominal pain in the left lower quadrant,                            Follow-up of diverticulitis Providers:                Mallie Mussel L. Loletha Carrow, MD, Laverta Baltimore RN, RN, Marcene Duos, Technician Referring MD:              Medicines:                Monitored Anesthesia Care Complications:            No immediate complications. Estimated Blood Loss:     Estimated blood loss: none. Procedure:                Pre-Anesthesia Assessment:                           - Prior to the procedure, a History and Physical                            was performed, and patient medications and                            allergies were reviewed. The patient's tolerance of                            previous anesthesia was also reviewed. The risks                            and benefits of the procedure and the sedation                            options and risks were discussed with the patient.                            All questions were answered, and informed consent                            was obtained. Prior Anticoagulants: The patient has                            taken no previous anticoagulant or antiplatelet                            agents. ASA Grade Assessment: II - A patient with  mild systemic disease. After reviewing the risks                            and benefits, the patient was deemed in                            satisfactory condition to undergo the procedure.                           After obtaining informed consent, the colonoscope                            was passed under direct vision. Throughout the                            procedure, the patient's  blood pressure, pulse, and                            oxygen saturations were monitored continuously. The                            EC-[id number] ([id number]) scope was introduced through                            the anus and advanced to the the cecum, identified                            by appendiceal orifice and ileocecal valve. The                            colonoscopy was performed with moderate difficulty                            due to poor bowel prep with stool present and a                            tortuous colon. The patient tolerated the procedure                            well. The quality of the bowel preparation was                            poor. The ileocecal valve, appendiceal orifice, and                            rectum were photographed. The bowel preparation                            used was SUPREP. Scope In: 9:29:34 AM Scope Out: 9:53:35 AM Scope Withdrawal Time: 0 hours 13 minutes 44 seconds  Total Procedure Duration: 0 hours 24 minutes 1 second  Findings:      The perianal and digital rectal examinations were normal.      Multiple medium-mouthed diverticula were found in  the left colon and       right colon.      There was evidence of a prior end-to-end colo-colonic anastomosis in the       sigmoid colon. This was patent and was characterized by healthy       appearing mucosa. The anastomosis was traversed. Just distal to the       anastomosis, the colon was tortuous but without fibrotic stricture. This       appears to be causing difficulty passing stool, since there was a very       large mass of retained solid stool in this area. A large volume of water       lavage was performed to soften the stool, and a snare was used to break       it into smaller pieces and aid with eventual passage.      Retroflexion in the rectum was not performed due to poor prep.      The exam was otherwise without abnormality. Impression:               - Preparation of the  colon was poor.                           - Diverticulosis in the left colon and in the right                            colon.                           - Patent end-to-end colo-colonic anastomosis,                            characterized by healthy appearing mucosa. Lumenal                            tortuosity without stricture.                           - The examination was otherwise normal.                           - No specimens collected. Moderate Sedation:      MAC sedation used Recommendation:           - Patient has a contact number available for                            emergencies. The signs and symptoms of potential                            delayed complications were discussed with the                            patient. Return to normal activities tomorrow.                            Written discharge instructions were provided to the  patient.                           - Resume previous diet.                           - Continue present medications.                           Today, put a medium sized bottle of miralax in 2                            liters of gatorade and consume 8-12 ouncecs every                            30 minutes until complete. This is necessary to                            complete a bowel purge.                           Then start a capful of miralax once daily to keep                            stool soft and prevent future impaction.                           - No recommendation on repeat colonoscopy at this                            time due to age.                           - Return to GI clinic as needed. Procedure Code(s):        --- Professional ---                           [phone], Colonoscopy, flexible; diagnostic, including                            collection of specimen(s) by brushing or washing,                            when performed (separate procedure) Diagnosis Code(s):        ---  Professional ---                           Z98.0, Intestinal bypass and anastomosis status                           R10.32, Left lower quadrant pain                           K57.32, Diverticulitis of large intestine without  perforation or abscess without bleeding                           K57.30, Diverticulosis of large intestine without                            perforation or abscess without bleeding CPT copyright 2016 American Medical Association. All rights reserved. The codes documented in this report are preliminary and upon coder review may  be revised to meet current compliance requirements. Taiquan Campanaro L. Loletha Carrow, MD 08/01/2016 10:08:03 AM This report has been signed electronically. Number of Addenda: 0

## 2016-08-01 NOTE — Discharge Instructions (Signed)
YOU HAD AN ENDOSCOPIC PROCEDURE TODAY: Refer to the procedure report and other information in the discharge instructions given to you for any specific questions about what was found during the examination. If this information does not answer your questions, please call [place] office at [phone] to clarify.  ° °YOU SHOULD EXPECT: Some feelings of bloating in the abdomen. Passage of more gas than usual. Walking can help get rid of the air that was put into your GI tract during the procedure and reduce the bloating. If you had a lower endoscopy (such as a colonoscopy or flexible sigmoidoscopy) you may notice spotting of blood in your stool or on the toilet paper. Some abdominal soreness may be present for a day or two, also. ° °DIET: Your first meal following the procedure should be a light meal and then it is ok to progress to your normal diet. A half-sandwich or bowl of soup is an example of a good first meal. Heavy or fried foods are harder to digest and may make you feel nauseous or bloated. Drink plenty of fluids but you should avoid alcoholic beverages for 24 hours. If you had a esophageal dilation, please see attached instructions for diet.   ° °ACTIVITY: Your care partner should take you home directly after the procedure. You should plan to take it easy, moving slowly for the rest of the day. You can resume normal activity the day after the procedure however YOU SHOULD NOT DRIVE, use power tools, machinery or perform tasks that involve climbing or major physical exertion for 24 hours (because of the sedation medicines used during the test).  ° °SYMPTOMS TO REPORT IMMEDIATELY: °A gastroenterologist can be reached at any hour. Please call [phone]  for any of the following symptoms:  °Following lower endoscopy (colonoscopy, flexible sigmoidoscopy) °Excessive amounts of blood in the stool  °Significant tenderness, worsening of abdominal pains  °Swelling of the abdomen that is new, acute  °Fever of 100° or  higher  °Following upper endoscopy (EGD, EUS, ERCP, esophageal dilation) °Vomiting of blood or coffee ground material  °New, significant abdominal pain  °New, significant chest pain or pain under the shoulder blades  °Painful or persistently difficult swallowing  °New shortness of breath  °Black, tarry-looking or red, bloody stools ° °FOLLOW UP:  °If any biopsies were taken you will be contacted by phone or by letter within the next 1-3 weeks. Call [phone]  if you have not heard about the biopsies in 3 weeks.  °Please also call with any specific questions about appointments or follow up tests. ° °

## 2016-08-01 NOTE — Interval H&P Note (Signed)
History and Physical Interval Note:  08/01/2016 9:19 AM  Nichole MastSamantha Mastrogiovanni  has presented today for surgery, with the diagnosis of left lower quadrant pain  The various methods of treatment have been discussed with the patient and family. After consideration of risks, benefits and other options for treatment, the patient has consented to  Procedure(s): COLONOSCOPY WITH PROPOFOL (N/A) as a surgical intervention .  The patient's history has been reviewed, patient examined, no change in status, stable for surgery.  I have reviewed the patient's chart and labs.  Questions were answered to the patient's satisfaction.     Charlie PitterHenry L Danis III

## 2016-08-01 NOTE — Anesthesia Postprocedure Evaluation (Signed)
Anesthesia Post Note  Patient: Nichole Mcintosh  Procedure(s) Performed: Procedure(s) (LRB): COLONOSCOPY WITH PROPOFOL (N/A)     Anesthesia Post Evaluation  Last Vitals:  Vitals:   08/01/16 1010 08/01/16 1020  BP: (!) 150/111 (!) 150/96  Pulse: 87 81  Resp: 17 18  Temp:      Last Pain:  Vitals:   08/01/16 0959  TempSrc: Oral                 Chaney Maclaren P Ailanie Ruttan

## 2016-08-01 NOTE — Transfer of Care (Signed)
Immediate Anesthesia Transfer of Care Note  Patient: Nichole Mcintosh  Procedure(s) Performed: Procedure(s): COLONOSCOPY WITH PROPOFOL (N/A)  Patient Location: PACU  Anesthesia Type:MAC  Level of Consciousness: Patient easily awoken, sedated, comfortable, cooperative, following commands, responds to stimulation.   Airway & Oxygen Therapy: Patient spontaneously breathing, ventilating well, oxygen via simple oxygen mask.  Post-op Assessment: Report given to PACU RN, vital signs reviewed and stable, moving all extremities.   Post vital signs: Reviewed and stable.  Complications: No apparent anesthesia complications Last Vitals:  Vitals:   08/01/16 0847  BP: 135/90  Pulse: (!) 102  Resp: 10  Temp: 36.8 C    Last Pain:  Vitals:   08/01/16 0847  TempSrc: Oral         Complications: No apparent anesthesia complications

## 2016-08-02 ENCOUNTER — Encounter (HOSPITAL_COMMUNITY): Payer: Self-pay | Admitting: Gastroenterology

## 2016-08-06 ENCOUNTER — Other Ambulatory Visit: Payer: Self-pay | Admitting: Family Medicine

## 2016-08-06 DIAGNOSIS — I10 Essential (primary) hypertension: Secondary | ICD-10-CM

## 2016-08-06 MED FILL — ADDERALL XR 30 MG CAP SA: 30 | 30 days supply | Qty: 30 | Fill #0

## 2016-08-07 MED FILL — AMLODIPINE BESYLATE 10 MG T: 10 | 90 days supply | Qty: 90 | Fill #0

## 2016-08-07 NOTE — Telephone Encounter (Signed)
Rx approved and sent to the pharmacy by e-script.//AB/CMA 

## 2016-08-14 ENCOUNTER — Encounter: Payer: Self-pay | Admitting: Gastroenterology

## 2016-08-20 MED FILL — OMEPRAZOLE DR 40 MG CAPSULE: 40 | 30 days supply | Qty: 30 | Fill #0

## 2016-08-20 MED FILL — METOPROLOL SUCC ER 50 MG TA: 50 | 90 days supply | Qty: 90 | Fill #1

## 2016-08-23 ENCOUNTER — Encounter: Payer: Self-pay | Admitting: Gastroenterology

## 2016-08-26 ENCOUNTER — Other Ambulatory Visit: Payer: Self-pay

## 2016-08-26 MED ORDER — LINACLOTIDE 145 MCG PO CAPS: 145.0000 ug | ORAL_CAPSULE | Freq: Every day | ORAL | 0 refills | Status: DC

## 2016-08-26 MED FILL — LINZESS 145 MCG CAPSULE: 145 | 30 days supply | Qty: 30 | Fill #0

## 2016-09-05 ENCOUNTER — Telehealth: Payer: Self-pay | Admitting: Family Medicine

## 2016-09-05 DIAGNOSIS — F900 Attention-deficit hyperactivity disorder, predominantly inattentive type: Secondary | ICD-10-CM

## 2016-09-05 MED ORDER — AMPHETAMINE-DEXTROAMPHET ER 30 MG PO CP24: 30.0000 mg | ORAL_CAPSULE | Freq: Every day | ORAL | 0 refills | Status: DC

## 2016-09-05 MED FILL — ADDERALL XR 30 MG CAP SA: 30 | 30 days supply | Qty: 30 | Fill #0

## 2016-09-05 NOTE — Telephone Encounter (Signed)
Printed/PCP signed/called the patient informed to pickup hardcopy's at the front desk. 

## 2016-09-05 NOTE — Telephone Encounter (Signed)
Requesting:   adderall Contract    11/03/2015 UDS    Low risk---06/07/2016 Last OV    07/03/2016 Last Refill    #30 on 05/20/16  Please Advise

## 2016-09-05 NOTE — Telephone Encounter (Signed)
OK to do 90 day supply. TY.

## 2016-09-05 NOTE — Telephone Encounter (Signed)
Relation to ZO:XWRUpt:self Call back number:[phone]  Reason for call:  Patient requesting a refill amphetamine-dextroamphetamine (ADDERALL XR) 30 MG 24 hr capsule

## 2016-09-30 MED FILL — OMEPRAZOLE DR 40 MG CAPSULE: 40 | 30 days supply | Qty: 30 | Fill #1

## 2016-10-04 MED FILL — ADDERALL XR 30 MG CAP SA: 30 | 30 days supply | Qty: 30 | Fill #0

## 2016-10-28 ENCOUNTER — Other Ambulatory Visit: Payer: Self-pay | Admitting: Gastroenterology

## 2016-10-28 NOTE — Telephone Encounter (Signed)
Refill request for omeprazole 40 mg one a day.Last seen 07-12-16.

## 2016-11-08 MED FILL — OMEPRAZOLE DR 40 MG CAPSULE: 40 | 30 days supply | Qty: 30 | Fill #0

## 2016-11-08 MED FILL — ADDERALL XR 30 MG CAP SA: 30 | 30 days supply | Qty: 30 | Fill #0

## 2016-11-12 ENCOUNTER — Encounter: Payer: Self-pay | Admitting: Gastroenterology

## 2016-11-12 ENCOUNTER — Other Ambulatory Visit: Payer: Self-pay

## 2016-11-12 ENCOUNTER — Encounter: Payer: Self-pay | Admitting: Family Medicine

## 2016-11-12 DIAGNOSIS — I10 Essential (primary) hypertension: Secondary | ICD-10-CM

## 2016-11-12 MED ORDER — LINACLOTIDE 145 MCG PO CAPS: 145.0000 ug | ORAL_CAPSULE | Freq: Every day | ORAL | 0 refills | Status: DC

## 2016-11-12 MED ORDER — METOPROLOL SUCCINATE ER 50 MG PO TB24: 50.0000 mg | ORAL_TABLET | Freq: Every day | ORAL | 0 refills | Status: DC

## 2016-11-12 MED FILL — AMLODIPINE BESYLATE 10 MG T: 10 | 90 days supply | Qty: 90 | Fill #1

## 2016-11-12 MED FILL — METOPROLOL SUCC ER 50 MG TA: 50 | 90 days supply | Qty: 90 | Fill #0

## 2016-12-04 ENCOUNTER — Other Ambulatory Visit: Payer: Self-pay | Admitting: Family Medicine

## 2016-12-04 DIAGNOSIS — F900 Attention-deficit hyperactivity disorder, predominantly inattentive type: Secondary | ICD-10-CM

## 2016-12-05 MED ORDER — AMPHETAMINE-DEXTROAMPHET ER 30 MG PO CP24: 30.0000 mg | ORAL_CAPSULE | Freq: Every day | ORAL | 0 refills | Status: DC

## 2016-12-09 MED FILL — ADDERALL XR 30 MG CAP SA: 30 | 30 days supply | Qty: 30 | Fill #0

## 2016-12-13 MED FILL — OMEPRAZOLE DR 40 MG CAPSULE: 40 | 30 days supply | Qty: 30 | Fill #1

## 2017-01-08 ENCOUNTER — Other Ambulatory Visit: Payer: Self-pay | Admitting: Family Medicine

## 2017-01-08 DIAGNOSIS — F900 Attention-deficit hyperactivity disorder, predominantly inattentive type: Secondary | ICD-10-CM

## 2017-01-08 MED ORDER — AMPHETAMINE-DEXTROAMPHET ER 30 MG PO CP24: 30.0000 mg | ORAL_CAPSULE | Freq: Every day | ORAL | 0 refills | Status: DC

## 2017-01-08 NOTE — Telephone Encounter (Signed)
Ok to refill 3 mo worth, but she needs 6 mo med check visit. Please schedule. TY.

## 2017-01-08 NOTE — Telephone Encounter (Signed)
Requesting:  adderall Contract   11/03/2015 UDS    06/07/2016 Last OV   07/03/2016 Last Refill   #30 on 12/05/2016  Please Advise

## 2017-01-08 NOTE — Telephone Encounter (Signed)
Called informed refills ready for pickup and to schedule appt. She has appt scheduled for this friday

## 2017-01-10 ENCOUNTER — Ambulatory Visit (INDEPENDENT_AMBULATORY_CARE_PROVIDER_SITE_OTHER): Payer: 59 | Admitting: Family Medicine

## 2017-01-10 ENCOUNTER — Encounter: Payer: Self-pay | Admitting: Family Medicine

## 2017-01-10 VITALS — BP 114/76 | HR 96 | Temp 98.0°F | Ht 64.0 in | Wt 162.2 lb

## 2017-01-10 DIAGNOSIS — Z720 Tobacco use: Secondary | ICD-10-CM

## 2017-01-10 DIAGNOSIS — F900 Attention-deficit hyperactivity disorder, predominantly inattentive type: Secondary | ICD-10-CM

## 2017-01-10 DIAGNOSIS — I10 Essential (primary) hypertension: Secondary | ICD-10-CM | POA: Diagnosis not present

## 2017-01-10 MED ORDER — NICOTINE 14 MG/24HR TD PT24: 14.0000 mg | MEDICATED_PATCH | Freq: Every day | TRANSDERMAL | 0 refills | Status: DC

## 2017-01-10 MED ORDER — OMEPRAZOLE 40 MG PO CPDR: 40.0000 mg | DELAYED_RELEASE_CAPSULE | Freq: Every day | ORAL | 2 refills | Status: DC

## 2017-01-10 MED ORDER — NICOTINE 7 MG/24HR TD PT24: 7.0000 mg | MEDICATED_PATCH | Freq: Every day | TRANSDERMAL | 0 refills | Status: DC

## 2017-01-10 MED FILL — OMEPRAZOLE DR 40 MG CAPSULE: 40 | 90 days supply | Qty: 90 | Fill #0

## 2017-01-10 MED FILL — NICOTINE 14 MG/24HR PATCH: 14 | 28 days supply | Qty: 28 | Fill #0

## 2017-01-10 MED FILL — ADDERALL XR 30 MG CAP SA: 30 | 30 days supply | Qty: 30 | Fill #0

## 2017-01-10 NOTE — Patient Instructions (Signed)
Keep the diet clean.   Aim to do some physical exertion for 150 minutes per week. This is typically divided into 5 days per week, 30 minutes per day. The activity should be enough to get your heart rate up. Anything is better than nothing if you have time constraints.  Sleep Hygiene Tips:  Do not watch TV or look at screens within 1 hour of going to bed. If you do, make sure there is a blue light filter (nighttime mode) involved.  Try to go to bed around the same time every night. Wake up at the same time within 1 hour of regular time. Ex: If you wake up at 7 AM for work, do not sleep past 8 AM on days that you don't work.  Do not drink alcohol before bedtime.  Do not consume caffeine-containing beverages after noon or within 9 hours of intended bedtime.  Get regular exercise/physical activity in your life, but not within 2 hours of planned bedtime.  Do not take naps.   Do not eat within 2 hours of planned bedtime.  Melatonin, 3-5 mg 30-60 minutes before planned bedtime may be helpful.   The bed should be for sleep or sex only. If after 20-30 minutes you are unable to fall asleep, get up and do something relaxing. Do this until you feel ready to go to sleep again.    Let us know if you need anything.

## 2017-01-10 NOTE — Progress Notes (Signed)
Pre visit review using our clinic review tool, if applicable. No additional management support is needed unless otherwise documented below in the visit note. 

## 2017-01-10 NOTE — Progress Notes (Signed)
Chief Complaint  Patient presents with  . Follow-up    medication    Nichole Mcintosh is 31 y.o. female here for ADHD follow up.  Patient is currently on Adderall and compliance is excellent. Symptoms include impulsivity, forgetfulness, inattention. Side effects include none. Patient believes their dose should be unchanged. Denies tics, weight loss, difficulties with sleep, self-medication, alcohol/drug abuse, chest pain, or palpitations.  Hypertension Patient presents for hypertension follow up. She does not routinely monitor home blood pressures. She is compliant with medications. Patient has these side effects of medication: none She is not adhering to a healthy diet overall. Exercise: some walking  Smoking Smoking less than 1/2 ppd. Would like to quit. Had tried patches in the past, would like to go back on them.    ROS:  Heart- denies chest pain or palpitations Psych- as noted in HPI  Past Medical History:  Diagnosis Date  . Attention deficit hyperactivity disorder (ADHD) 11/03/2015  . Colitis   . Depression   . Diverticulitis   . GERD (gastroesophageal reflux disease)   . Headache   . Heart murmur    as a child   . Hypertension   . PONV (postoperative nausea and vomiting)    Social History   Socioeconomic History  . Marital status: Married  Occupational History  . Occupation: nurse  Tobacco Use  . Smoking status: Current Every Day Smoker    Packs/day: 0.50    Types: Cigarettes  . Smokeless tobacco: Never Used  Substance and Sexual Activity  . Alcohol use: Yes    Comment: rare  . Drug use: No   Allergies as of 01/10/2017   No Known Allergies     Medication List        Accurate as of 01/10/17 10:19 AM. Always use your most recent med list.          amLODipine 10 MG tablet Commonly known as:  NORVASC TAKE 1 TABLET BY MOUTH ONCE DAILY   amphetamine-dextroamphetamine 30 MG 24 hr capsule Commonly known as:  ADDERALL XR Take 1 capsule (30 mg total)  daily by mouth.   cetirizine 10 MG tablet Commonly known as:  ZYRTEC Take 10 mg by mouth daily.   docusate sodium 100 MG capsule Commonly known as:  COLACE Take 100 mg by mouth 2 (two) times daily.   linaclotide 145 MCG Caps capsule Commonly known as:  LINZESS Take 1 capsule (145 mcg total) by mouth daily before breakfast.   metoprolol succinate 50 MG 24 hr tablet Commonly known as:  TOPROL-XL Take 1 tablet (50 mg total) by mouth daily. Take with or immediately following a meal.   nicotine 14 mg/24hr patch Commonly known as:  NICODERM CQ - dosed in mg/24 hours Place 1 patch (14 mg total) daily onto the skin.   nicotine 7 mg/24hr patch Commonly known as:  NICODERM CQ - dosed in mg/24 hr Place 1 patch (7 mg total) daily onto the skin. Start taking on:  02/21/2017   omeprazole 40 MG capsule Commonly known as:  PRILOSEC Take 1 capsule (40 mg total) daily by mouth.       BP 114/76 (BP Location: Left Arm, Patient Position: Sitting, Cuff Size: Normal)   Pulse 96   Temp 98 F (36.7 C) (Oral)   Ht 5\' 4"  (1.626 m)   Wt 162 lb 4 oz (73.6 kg)   SpO2 97%   BMI 27.85 kg/m  Gen- awake, alert, appearing stated age HEENT- PERRLA, MMM Heart- RRR, no  murmurs, no bruits Lungs- CTAB, no accessory muscle use Abd- soft, NT, ND, no masses or organomegaly Neuro- no facial tics Psych- age appropriate judgment and insight, normal mood and affect  Attention deficit hyperactivity disorder (ADHD), predominantly inattentive type  Essential hypertension  Tobacco abuse  14 mg/d patches for 6 weeks then 2 weeks of 7 mg/d patches . Cont current regimen for HTN. Cont Adderall. Counseled on sleep hygiene. F/u in 6 mo for med recheck. Pt voiced understanding and agreement to the plan.  Jilda Rocheicholas Paul LuckeyWendling, DO 01/10/17 10:19 AM

## 2017-02-06 ENCOUNTER — Other Ambulatory Visit: Payer: Self-pay | Admitting: Family Medicine

## 2017-02-06 DIAGNOSIS — I10 Essential (primary) hypertension: Secondary | ICD-10-CM

## 2017-02-06 MED ORDER — METOPROLOL SUCCINATE ER 50 MG PO TB24: 50.0000 mg | ORAL_TABLET | Freq: Every day | ORAL | 1 refills | Status: DC

## 2017-02-06 MED FILL — AMLODIPINE BESYLATE 10 MG T: 10 | 90 days supply | Qty: 90 | Fill #0

## 2017-02-06 MED FILL — METOPROLOL SUCC ER 50 MG TA: 50 | 90 days supply | Qty: 90 | Fill #0

## 2017-02-06 MED FILL — ADDERALL XR 30 MG CAP SA: 30 | 30 days supply | Qty: 30 | Fill #0

## 2017-03-12 ENCOUNTER — Encounter: Payer: Self-pay | Admitting: Family Medicine

## 2017-03-12 MED FILL — ADDERALL XR 30 MG CAP SA: 30 | 30 days supply | Qty: 30 | Fill #0

## 2017-03-31 ENCOUNTER — Encounter: Payer: Self-pay | Admitting: Family Medicine

## 2017-03-31 MED ORDER — NICOTINE 14 MG/24HR TD PT24: 14.0000 mg | MEDICATED_PATCH | Freq: Every day | TRANSDERMAL | 0 refills | Status: AC

## 2017-03-31 MED ORDER — NICOTINE 7 MG/24HR TD PT24: 7.0000 mg | MEDICATED_PATCH | Freq: Every day | TRANSDERMAL | 0 refills | Status: AC

## 2017-03-31 MED ORDER — NICOTINE 21 MG/24HR TD PT24: 21.0000 mg | MEDICATED_PATCH | Freq: Every day | TRANSDERMAL | 0 refills | Status: AC

## 2017-03-31 MED FILL — NICOTINE 7 MG/24HR PATCH: 7 | 14 days supply | Qty: 14 | Fill #0

## 2017-03-31 MED FILL — NICOTINE 21 MG/24HR PATCH: 21 | 42 days supply | Qty: 42 | Fill #0

## 2017-03-31 MED FILL — NICOTINE 14 MG/24HR PATCH: 14 | 14 days supply | Qty: 14 | Fill #0

## 2017-04-03 MED FILL — OMEPRAZOLE DR 40 MG CAPSULE: 40 | 90 days supply | Qty: 90 | Fill #1

## 2017-04-16 ENCOUNTER — Other Ambulatory Visit: Payer: Self-pay | Admitting: Family Medicine

## 2017-04-16 DIAGNOSIS — F900 Attention-deficit hyperactivity disorder, predominantly inattentive type: Secondary | ICD-10-CM

## 2017-04-16 MED ORDER — AMPHETAMINE-DEXTROAMPHET ER 30 MG PO CP24: 30.0000 mg | ORAL_CAPSULE | Freq: Every day | ORAL | 0 refills | Status: DC

## 2017-04-16 MED FILL — ADDERALL XR 30 MG CAP SA: 30 | 30 days supply | Qty: 30 | Fill #0

## 2017-04-16 NOTE — Telephone Encounter (Signed)
Rx sent electronically. Have pt update CSC at her earliest convenience. TY.

## 2017-04-16 NOTE — Telephone Encounter (Signed)
Requesting:Adderall Contract: 11/03/15 UDS:12/08/15 low risk needs updated Last Visit: 01/10/17 Next Visit: none Last Refill:01/08/17  Please Advise

## 2017-05-20 ENCOUNTER — Other Ambulatory Visit: Payer: Self-pay | Admitting: Family Medicine

## 2017-05-20 DIAGNOSIS — F900 Attention-deficit hyperactivity disorder, predominantly inattentive type: Secondary | ICD-10-CM

## 2017-05-20 MED ORDER — AMPHETAMINE-DEXTROAMPHET ER 30 MG PO CP24: 30.0000 mg | ORAL_CAPSULE | ORAL | 0 refills | Status: DC

## 2017-05-20 MED ORDER — AMPHETAMINE-DEXTROAMPHET ER 30 MG PO CP24
30.0000 mg | ORAL_CAPSULE | ORAL | 0 refills | Status: DC
Start: 2017-05-20 — End: 2017-09-17

## 2017-05-20 MED ORDER — AMPHETAMINE-DEXTROAMPHET ER 30 MG PO CP24: 30.0000 mg | ORAL_CAPSULE | Freq: Every day | ORAL | 0 refills | Status: DC

## 2017-05-20 MED FILL — METOPROLOL SUCCINATE ER 50: 50 | 90 days supply | Qty: 90 | Fill #1

## 2017-05-20 MED FILL — AMLODIPINE BESYLATE 10 MG T: 10 | 90 days supply | Qty: 90 | Fill #1

## 2017-05-20 MED FILL — ADDERALL XR 30 MG CAP SA: 30 | 30 days supply | Qty: 30 | Fill #0

## 2017-05-20 NOTE — Telephone Encounter (Signed)
Approved.  

## 2017-05-20 NOTE — Telephone Encounter (Signed)
Requesting:Adderall Contract:11/03/15 UDS:12/08/15 Low risk. Needs updated CSC AND UDS Last Visit:01/10/17 Next Visit: no upcoming appt Last Refill:04/16/17  Please Advise

## 2017-06-23 MED FILL — ADDERALL XR 30 MG CAP SA: 30 | 30 days supply | Qty: 30 | Fill #0

## 2017-06-25 MED FILL — OMEPRAZOLE DR 40 MG CAPSULE: 40 | 90 days supply | Qty: 90 | Fill #2

## 2017-07-23 MED FILL — ADDERALL XR 30 MG CAP SA: 30 | 30 days supply | Qty: 30 | Fill #0

## 2017-07-24 ENCOUNTER — Other Ambulatory Visit: Payer: Self-pay | Admitting: Family Medicine

## 2017-07-24 DIAGNOSIS — F900 Attention-deficit hyperactivity disorder, predominantly inattentive type: Secondary | ICD-10-CM

## 2017-08-19 ENCOUNTER — Other Ambulatory Visit: Payer: Self-pay | Admitting: Family Medicine

## 2017-08-19 DIAGNOSIS — I10 Essential (primary) hypertension: Secondary | ICD-10-CM

## 2017-08-20 MED FILL — AMLODIPINE BESYLATE 10 MG T: 10 | 90 days supply | Qty: 90 | Fill #0

## 2017-08-20 MED FILL — METOPROLOL SUCCINATE ER 50: 50 | 90 days supply | Qty: 90 | Fill #0

## 2017-08-22 ENCOUNTER — Other Ambulatory Visit: Payer: Self-pay | Admitting: Family Medicine

## 2017-08-22 MED ORDER — AMPHETAMINE-DEXTROAMPHET ER 30 MG PO CP24: 30.0000 mg | ORAL_CAPSULE | ORAL | 0 refills | Status: DC

## 2017-08-22 MED FILL — ADDERALL XR 30 MG CAP SA: 30 | 30 days supply | Qty: 30 | Fill #0

## 2017-08-27 ENCOUNTER — Other Ambulatory Visit: Payer: Self-pay | Admitting: Family Medicine

## 2017-08-27 DIAGNOSIS — F900 Attention-deficit hyperactivity disorder, predominantly inattentive type: Secondary | ICD-10-CM

## 2017-08-29 ENCOUNTER — Encounter: Payer: Self-pay | Admitting: Family Medicine

## 2017-08-29 ENCOUNTER — Ambulatory Visit (INDEPENDENT_AMBULATORY_CARE_PROVIDER_SITE_OTHER): Payer: No Typology Code available for payment source | Admitting: Family Medicine

## 2017-08-29 VITALS — BP 108/68 | HR 95 | Temp 98.3°F | Ht 64.0 in | Wt 159.0 lb

## 2017-08-29 DIAGNOSIS — Z79899 Other long term (current) drug therapy: Secondary | ICD-10-CM

## 2017-08-29 DIAGNOSIS — F900 Attention-deficit hyperactivity disorder, predominantly inattentive type: Secondary | ICD-10-CM

## 2017-08-29 DIAGNOSIS — Z0000 Encounter for general adult medical examination without abnormal findings: Secondary | ICD-10-CM | POA: Diagnosis not present

## 2017-08-29 LAB — COMPREHENSIVE METABOLIC PANEL
ALT: 14 U/L (ref 0–35)
AST: 14 U/L (ref 0–37)
Albumin: 4.4 g/dL (ref 3.5–5.2)
Alkaline Phosphatase: 92 U/L (ref 39–117)
BUN: 11 mg/dL (ref 6–23)
CO2: 27 meq/L (ref 19–32)
Calcium: 9.6 mg/dL (ref 8.4–10.5)
Chloride: 104 mEq/L (ref 96–112)
Creatinine, Ser: 0.64 mg/dL (ref 0.40–1.20)
GFR: 112.62 mL/min (ref >60.00)
GLUCOSE: 110 mg/dL — AB (ref 70–99)
POTASSIUM: 3.9 meq/L (ref 3.5–5.1)
Sodium: 140 mEq/L (ref 135–145)
Total Bilirubin: 0.5 mg/dL (ref 0.2–1.2)
Total Protein: 6.8 g/dL (ref 6.0–8.3)

## 2017-08-29 LAB — CBC
HEMATOCRIT: 41.6 % (ref 36.0–46.0)
Hemoglobin: 14.2 g/dL (ref 12.0–15.0)
MCHC: 34.2 g/dL (ref 30.0–36.0)
MCV: 91.2 fl (ref 78.0–100.0)
PLATELETS: 402 10*3/uL — AB (ref 150.0–400.0)
RBC: 4.57 Mil/uL (ref 3.87–5.11)
RDW: 13.3 % (ref 11.5–15.5)
WBC: 10 10*3/uL (ref 4.0–10.5)

## 2017-08-29 LAB — LIPID PANEL
CHOL/HDL RATIO: 6
Cholesterol: 255 mg/dL — ABNORMAL HIGH (ref 0–200)
HDL: 46.1 mg/dL (ref >39.00)
LDL Cholesterol: 196 mg/dL — ABNORMAL HIGH (ref 0–99)
NONHDL: 209.03
Triglycerides: 67 mg/dL (ref 0.0–149.0)
VLDL: 13.4 mg/dL (ref 0.0–40.0)

## 2017-08-29 NOTE — Patient Instructions (Addendum)
Stop smoking.  Keep the diet clean, stay physically active.  1-2 days to get results of labs back.   Let us know if you need anything.

## 2017-08-29 NOTE — Progress Notes (Signed)
Pre visit review using our clinic review tool, if applicable. No additional management support is needed unless otherwise documented below in the visit note. 

## 2017-08-29 NOTE — Progress Notes (Signed)
Chief Complaint  Patient presents with  . Annual Exam     Well Woman Shawonda Kerce is here for a complete physical.   Her last physical was >1 year ago.  Current diet: in general, a "healthy" diet. Current exercise: physically active at work and home, walking dog. No LMP recorded. Seatbelt? Yes   Health Maintenance Pap/HPV- No- has one scheduled next mo Tetanus- Yes HIV screening- Yes  Past Medical History:  Diagnosis Date  . Attention deficit hyperactivity disorder (ADHD) 11/03/2015  . Colitis   . Depression   . Diverticulitis   . GERD (gastroesophageal reflux disease)   . Headache   . Heart murmur    as a child   . Hypertension   . PONV (postoperative nausea and vomiting)      Past Surgical History:  Procedure Laterality Date  . COLON SURGERY  2011   Partial colectomy, diverticulitis  . COLONOSCOPY WITH PROPOFOL N/A 08/01/2016   Procedure: COLONOSCOPY WITH PROPOFOL;  Surgeon: Sherrilyn Rist, MD;  Location: [facility];  Service: Gastroenterology;  Laterality: N/A;  . TUBAL LIGATION  2004    Medications  Current Outpatient Medications on File Prior to Visit  Medication Sig Dispense Refill  . amLODipine (NORVASC) 10 MG tablet TAKE 1 TABLET BY MOUTH ONCE DAILY 90 tablet 1  . amphetamine-dextroamphetamine (ADDERALL XR) 30 MG 24 hr capsule Take 1 capsule (30 mg total) by mouth daily. 30 capsule 0  . amphetamine-dextroamphetamine (ADDERALL XR) 30 MG 24 hr capsule Take 1 capsule (30 mg total) by mouth every morning. 30 capsule 0  . amphetamine-dextroamphetamine (ADDERALL XR) 30 MG 24 hr capsule Take 1 capsule (30 mg total) by mouth every morning. 30 capsule 0  . cetirizine (ZYRTEC) 10 MG tablet Take 10 mg by mouth daily.    . metoprolol succinate (TOPROL-XL) 50 MG 24 hr tablet TAKE 1 TABLET (50 MG TOTAL) BY MOUTH DAILY. TAKE WITH OR IMMEDIATELY FOLLOWING A MEAL. 90 tablet 1  . omeprazole (PRILOSEC) 40 MG capsule Take 1 capsule (40 mg total) daily by mouth. 90 capsule 2    Allergies No Known Allergies  Review of Systems: Constitutional:  no unexpected weight changes Eye:  no recent significant change in vision Ear/Nose/Mouth/Throat:  Ears:  no tinnitus or vertigo and no recent change in hearing Nose/Mouth/Throat:  no complaints of nasal congestion, no sore throat Cardiovascular: no chest pain Respiratory:  no cough and no shortness of breath Gastrointestinal:  no abdominal pain, no change in bowel habits GU:  Female: negative for dysuria or pelvic pain Musculoskeletal/Extremities:  no pain of the joints Integumentary (Skin/Breast):  no abnormal skin lesions reported Neurologic:  no headaches Endocrine:  denies fatigue Hematologic/Lymphatic:  No areas of easy bleeding  Exam BP 108/68 (BP Location: Left Arm, Patient Position: Sitting, Cuff Size: Normal)   Pulse 95   Temp 98.3 F (36.8 C) (Oral)   Ht 5\' 4"  (1.626 m)   Wt 159 lb (72.1 kg)   BMI 27.29 kg/m  General:  well developed, well nourished, in no apparent distress Skin:  no significant moles, warts, or growths Head:  no masses, lesions, or tenderness Eyes:  pupils equal and round, sclera anicteric without injection Ears:  canals without lesions, TMs shiny without retraction, no obvious effusion, no erythema Nose:  nares patent, septum midline, mucosa normal, and no drainage or sinus tenderness Throat/Pharynx:  lips and gingiva without lesion; tongue and uvula midline; non-inflamed pharynx; no exudates or postnasal drainage Neck: neck supple without adenopathy,  thyromegaly, or masses Lungs:  clear to auscultation, breath sounds equal bilaterally, no respiratory distress Cardio:  regular rate and rhythm, no bruits, no LE edema Abdomen:  abdomen soft, nontender; bowel sounds normal; no masses or organomegaly Genital: Defer to GYN Musculoskeletal:  symmetrical muscle groups noted without atrophy or deformity Extremities:  no clubbing, cyanosis, or edema, no deformities, no skin  discoloration Neuro:  gait normal; deep tendon reflexes normal and symmetric Psych: well oriented with normal range of affect and appropriate judgment/insight  Assessment and Plan  Well adult exam - Plan: Comprehensive metabolic panel, CBC, Lipid panel  Attention deficit hyperactivity disorder (ADHD), predominantly inattentive type  Encounter for long-term (current) use of high-risk medication - Plan: Pain Mgmt, Profile 8 w/Conf, U   Well 32 y.o. female. Counseled on diet and exercise. Stop smoking. Other orders as above. Follow up 6 mo. The patient voiced understanding and agreement to the plan.  Jilda Rocheicholas Paul North WildwoodWend[place], DO 08/29/17 11:53 AM

## 2017-09-01 LAB — PAIN MGMT, PROFILE 8 W/CONF, U
6 ACETYLMORPHINE: NEGATIVE ng/mL (ref <10)
ALCOHOL METABOLITES: NEGATIVE ng/mL (ref <500)
ALPHAHYDROXYMIDAZOLAM: NEGATIVE ng/mL (ref <50)
AMINOCLONAZEPAM: NEGATIVE ng/mL (ref <25)
AMPHETAMINES: POSITIVE ng/mL — AB (ref <500)
Alphahydroxyalprazolam: NEGATIVE ng/mL (ref <25)
Alphahydroxytriazolam: NEGATIVE ng/mL (ref <50)
Amphetamine: 7906 ng/mL — ABNORMAL HIGH (ref <250)
Benzodiazepines: NEGATIVE ng/mL (ref <100)
Buprenorphine, Urine: NEGATIVE ng/mL (ref <5)
COCAINE METABOLITE: NEGATIVE ng/mL (ref <150)
CREATININE: 324.6 mg/dL
Hydroxyethylflurazepam: NEGATIVE ng/mL (ref <50)
Lorazepam: NEGATIVE ng/mL (ref <50)
MDMA: NEGATIVE ng/mL (ref <500)
Marijuana Metabolite: NEGATIVE ng/mL (ref <20)
Methamphetamine: NEGATIVE ng/mL (ref <250)
NORDIAZEPAM: NEGATIVE ng/mL (ref <50)
OPIATES: NEGATIVE ng/mL (ref <100)
OXIDANT: NEGATIVE ug/mL (ref <200)
Oxazepam: NEGATIVE ng/mL (ref <50)
Oxycodone: NEGATIVE ng/mL (ref <100)
TEMAZEPAM: NEGATIVE ng/mL (ref <50)
pH: 6.56 (ref 4.5–9.0)

## 2017-09-02 ENCOUNTER — Other Ambulatory Visit: Payer: Self-pay | Admitting: Family Medicine

## 2017-09-02 DIAGNOSIS — R739 Hyperglycemia, unspecified: Secondary | ICD-10-CM

## 2017-09-02 DIAGNOSIS — E785 Hyperlipidemia, unspecified: Secondary | ICD-10-CM

## 2017-09-17 ENCOUNTER — Ambulatory Visit (INDEPENDENT_AMBULATORY_CARE_PROVIDER_SITE_OTHER): Payer: No Typology Code available for payment source | Admitting: Obstetrics & Gynecology

## 2017-09-17 ENCOUNTER — Encounter: Payer: Self-pay | Admitting: Obstetrics & Gynecology

## 2017-09-17 VITALS — BP 133/88 | HR 109 | Ht 64.0 in | Wt 157.0 lb

## 2017-09-17 DIAGNOSIS — B373 Candidiasis of vulva and vagina: Secondary | ICD-10-CM

## 2017-09-17 DIAGNOSIS — N946 Dysmenorrhea, unspecified: Secondary | ICD-10-CM

## 2017-09-17 DIAGNOSIS — Z124 Encounter for screening for malignant neoplasm of cervix: Secondary | ICD-10-CM

## 2017-09-17 DIAGNOSIS — Z1151 Encounter for screening for human papillomavirus (HPV): Secondary | ICD-10-CM | POA: Diagnosis not present

## 2017-09-17 DIAGNOSIS — Z01419 Encounter for gynecological examination (general) (routine) without abnormal findings: Secondary | ICD-10-CM | POA: Diagnosis not present

## 2017-09-17 DIAGNOSIS — L680 Hirsutism: Secondary | ICD-10-CM

## 2017-09-17 DIAGNOSIS — B977 Papillomavirus as the cause of diseases classified elsewhere: Secondary | ICD-10-CM

## 2017-09-17 MED ORDER — TRANEXAMIC ACID 650 MG PO TABS
1300.0000 mg | ORAL_TABLET | Freq: Three times a day (TID) | ORAL | 2 refills | Status: DC
Start: 2017-09-17 — End: 2018-05-02

## 2017-09-17 NOTE — Progress Notes (Signed)
Pt states that her last pap smear was seven years ago.

## 2017-09-17 NOTE — Progress Notes (Signed)
Subjective:     Nichole AbedSamantha Mcintosh is a 32 y.o. female here for a routine exam.  [id number]  LMP 08/20/2017 h/o preeclampsia. Pt reports a h/o heavy painful cycles. This is not new but, is getting worse.  She takes many NSAIDS. Pt bleeds very heavy and often messes up her clothes. She had a colectomy for severe diverticulitis. She had a f/u CT and cysts were noted on the ovaries.  Current complaints: pt was dx'd with hrHPV. She is s/p cryo or a 'liquid that was placed on the cervix.'      Gynecologic History Patient's last menstrual period was 08/20/2017. Contraception: tubal ligation 2004 Last Pap: 7 years prev. Results were: abnormal Last mammogram: n/a.   Obstetric History OB History  Gravida Para Term Preterm AB Living  2 2 1 1   2   SAB TAB Ectopic Multiple Live Births          2    # Outcome Date GA Lbr Len/2nd Weight Sex Delivery Anes PTL Lv  2 Preterm 07/07/02 1154w0d   M  EPI  LIV  1 Term 2002 6036w0d   F Vag-Spont EPI N LIV   The following portions of the patient's history were reviewed and updated as appropriate: allergies, current medications, past family history, past medical history, past social history, past surgical history and problem list.  Review of Systems Pertinent items are noted in HPI.    Objective:  BP 133/88   Pulse (!) 109   Ht 5\' 4"  (1.626 m)   Wt 157 lb (71.2 kg)   LMP 08/20/2017   BMI 26.95 kg/m   General Appearance:    Alert, cooperative, no distress, appears stated age; increased hair growth on side of face, chin and throat  Head:    Normocephalic, without obvious abnormality, atraumatic  Eyes:    conjunctiva/corneas clear, EOM's intact, both eyes  Ears:    Normal external ear canals, both ears  Nose:   Nares normal, septum midline, mucosa normal, no drainage    or sinus tenderness  Throat:   Lips, mucosa, and tongue normal; teeth and gums normal  Neck:   Supple, symmetrical, trachea midline, no adenopathy;    thyroid:  no enlargement/tenderness/nodules   Back:     Symmetric, no curvature, ROM normal, no CVA tenderness  Lungs:     Clear to auscultation bilaterally, respirations unlabored  Chest Wall:    No tenderness or deformity   Heart:    Regular rate and rhythm, S1 and S2 normal, no murmur, rub   or gallop  Breast Exam:    No tenderness, masses, or nipple abnormality  Abdomen:     Soft, non-tender, bowel sounds active all four quadrants,    no masses, no organomegaly  Genitalia:    Normal female without lesion, discharge or tenderness     Extremities:   Extremities normal, atraumatic, no cyanosis or edema  Pulses:   2+ and symmetric all extremities  Skin:   Skin color, texture, turgor normal, no rashes or lesions    Assessment:    Healthy female exam.   hirsutism menorrhagia Dysmenorrhea tob use H/o abnormal PAP      Plan:    rec tobacco cessation   F/u for LnIUD- next available F/u PAP and hrHPV     TXA 1300mg  tid with heavy bleeding until LnIUD placed.  Need records from Dr. Roselyn Meierorton's ofc.   Nichole Mcintosh, M.D., Evern CoreFACOG

## 2017-09-17 NOTE — Patient Instructions (Signed)

## 2017-09-18 ENCOUNTER — Encounter: Payer: Self-pay | Admitting: Obstetrics & Gynecology

## 2017-09-18 LAB — DHEA-SULFATE: DHEA SO4: 290.8 ug/dL (ref 84.8–378.0)

## 2017-09-18 LAB — TESTOSTERONE: TESTOSTERONE: 39 ng/dL (ref 8–48)

## 2017-09-18 LAB — FOLLICLE STIMULATING HORMONE: FSH: 2 m[IU]/mL

## 2017-09-19 ENCOUNTER — Other Ambulatory Visit: Payer: No Typology Code available for payment source

## 2017-09-22 ENCOUNTER — Telehealth: Payer: Self-pay

## 2017-09-22 ENCOUNTER — Encounter: Payer: Self-pay | Admitting: Family Medicine

## 2017-09-22 LAB — CYTOLOGY - PAP
Bacterial vaginitis: NEGATIVE
Candida vaginitis: POSITIVE — AB
Diagnosis: NEGATIVE
HPV: DETECTED — AB

## 2017-09-22 NOTE — Telephone Encounter (Signed)
Pt called the office requesting PAP results. Pt advised to call the office back by Wednesday because her PAP has not resulted.Understanding was voiced.

## 2017-09-24 ENCOUNTER — Telehealth: Payer: Self-pay

## 2017-09-24 MED ORDER — FLUCONAZOLE 100 MG PO TABS: 100.0000 mg | ORAL_TABLET | Freq: Every day | ORAL | 1 refills | Status: DC

## 2017-09-24 NOTE — Telephone Encounter (Signed)
Patient called for pap results. Patient made aware pap smear did show HPV + but Dr. Erin FullingHarraway Smith states that she will repeat pap smear in one year.   Patient made aware that the pap smear did have yeast on it and patient requests medication for yeast infection. [facility] Rx to [facility].

## 2017-10-03 ENCOUNTER — Other Ambulatory Visit: Payer: Self-pay | Admitting: Family Medicine

## 2017-10-03 MED ORDER — AMPHETAMINE-DEXTROAMPHET ER 30 MG PO CP24: 30.0000 mg | ORAL_CAPSULE | ORAL | 0 refills | Status: DC

## 2017-10-07 ENCOUNTER — Encounter: Payer: Self-pay | Admitting: Family Medicine

## 2017-10-07 ENCOUNTER — Other Ambulatory Visit: Payer: Self-pay | Admitting: Family Medicine

## 2017-10-07 DIAGNOSIS — F900 Attention-deficit hyperactivity disorder, predominantly inattentive type: Secondary | ICD-10-CM

## 2017-10-09 ENCOUNTER — Other Ambulatory Visit: Payer: Self-pay | Admitting: Family Medicine

## 2017-10-09 MED ORDER — AMPHETAMINE-DEXTROAMPHET ER 30 MG PO CP24: 30.0000 mg | ORAL_CAPSULE | ORAL | 0 refills | Status: DC

## 2017-10-09 MED FILL — OMEPRAZOLE 40 MG CPDR: 40 | 90 days supply | Qty: 90 | Fill #0

## 2017-10-09 MED FILL — ADDERALL XR 30 MG CAP SA: 30 | 30 days supply | Qty: 30 | Fill #0

## 2017-10-13 ENCOUNTER — Ambulatory Visit: Payer: No Typology Code available for payment source | Admitting: Obstetrics & Gynecology

## 2017-10-13 ENCOUNTER — Telehealth: Payer: Self-pay | Admitting: Family Medicine

## 2017-10-13 NOTE — Telephone Encounter (Signed)
Copied from CRM [id number]. Topic: Quick Communication - See Telephone Encounter >> Oct 13, 2017  3:41 PM Tamela OddiMartin, Nichole, NT wrote: CRM for notification. See Telephone encounter for: 10/13/17. Patient called and states that her pharmacy didn't received the medication amphetamine-dextroamphetamine (ADDERALL XR) 30 MG 24 hr capsule. Please re send  [facility] [place], [place] - [id number] [phone] (Phone) [phone] (Fax)

## 2017-10-13 NOTE — Telephone Encounter (Signed)
Noted  

## 2017-10-13 NOTE — Telephone Encounter (Signed)
Pt called back and states pharmacy found her script, she doesn't need it called in again.

## 2017-10-30 ENCOUNTER — Encounter: Payer: Self-pay | Admitting: Family Medicine

## 2017-10-30 ENCOUNTER — Ambulatory Visit (INDEPENDENT_AMBULATORY_CARE_PROVIDER_SITE_OTHER): Payer: No Typology Code available for payment source | Admitting: Family Medicine

## 2017-10-30 VITALS — BP 120/80 | HR 83 | Temp 98.3°F | Ht 64.0 in | Wt 157.4 lb

## 2017-10-30 DIAGNOSIS — E785 Hyperlipidemia, unspecified: Secondary | ICD-10-CM | POA: Diagnosis not present

## 2017-10-30 DIAGNOSIS — J0100 Acute maxillary sinusitis, unspecified: Secondary | ICD-10-CM

## 2017-10-30 DIAGNOSIS — R739 Hyperglycemia, unspecified: Secondary | ICD-10-CM | POA: Diagnosis not present

## 2017-10-30 LAB — LIPID PANEL
CHOL/HDL RATIO: 6
CHOLESTEROL: 244 mg/dL — AB (ref 0–200)
HDL: 41.6 mg/dL (ref >39.00)
LDL CALC: 182 mg/dL — AB (ref 0–99)
NonHDL: 202.71
TRIGLYCERIDES: 102 mg/dL (ref 0.0–149.0)
VLDL: 20.4 mg/dL (ref 0.0–40.0)

## 2017-10-30 LAB — HEMOGLOBIN A1C: Hgb A1c MFr Bld: 6.3 % (ref 4.6–6.5)

## 2017-10-30 MED ORDER — FLUCONAZOLE 150 MG PO TABS: 150.0000 mg | ORAL_TABLET | Freq: Once | ORAL | 0 refills | Status: AC

## 2017-10-30 MED ORDER — AMOXICILLIN-POT CLAVULANATE 875-125 MG PO TABS: 1.0000 | ORAL_TABLET | Freq: Two times a day (BID) | ORAL | 0 refills | Status: DC

## 2017-10-30 MED ORDER — FLUTICASONE PROPIONATE 50 MCG/ACT NA SUSP: 2.0000 | Freq: Every day | NASAL | 2 refills | Status: DC

## 2017-10-30 MED FILL — FLUTICASONE PROP 50 MCG SPR: 50 | 30 days supply | Qty: 16 | Fill #0

## 2017-10-30 MED FILL — AMOX-CLAV 875-125 MG TABLET: 875-125 | 10 days supply | Qty: 20 | Fill #0

## 2017-10-30 MED FILL — FLUCONAZOLE 150 MG TABS: 150 | 2 days supply | Qty: 2 | Fill #0

## 2017-10-30 NOTE — Progress Notes (Signed)
Chief Complaint  Patient presents with  . Sinusitis  . Headache    Nichole Mcintosh here for URI complaints.  Duration: 1 week  Associated symptoms: sinus pain, upper dental pain, rhinorrhea, sore throat and light cough Denies: sinus congestion, itchy watery eyes, ear pain, ear drainage, wheezing, shortness of breath, myalgia and fevers Treatment to date: Tylenol cold and flu Sick contacts: No  ROS:  Const: Denies fevers HEENT: As noted in HPI Lungs: No SOB  Past Medical History:  Diagnosis Date  . Attention deficit hyperactivity disorder (ADHD) 11/03/2015  . Colitis   . Depression   . Diverticulitis   . GERD (gastroesophageal reflux disease)   . Headache   . Heart murmur    as a child   . Hypertension   . PONV (postoperative nausea and vomiting)    BP 120/80 (BP Location: Left Arm, Patient Position: Sitting, Cuff Size: Normal)   Pulse 83   Temp 98.3 F (36.8 C) (Oral)   Ht 5\' 4"  (1.626 m)   Wt 157 lb 6 oz (71.4 kg)   SpO2 98%   BMI 27.01 kg/m  General: Awake, alert, appears stated age HEENT: AT, [place], ears patent b/l and TM's neg, nares patent w/o discharge, +ttp over max and frontal sinuses b/l, pharynx pink and without exudates, MMM Neck: No masses or asymmetry Heart: RRR Lungs: CTAB, no accessory muscle use Psych: Age appropriate judgment and insight, normal mood and affect  Acute maxillary sinusitis, recurrence not specified - Plan: amoxicillin-clavulanate (AUGMENTIN) 875-125 MG tablet, fluconazole (DIFLUCAN) 150 MG tablet, fluticasone (FLONASE) 50 MCG/ACT nasal spray  Hyperlipidemia, unspecified hyperlipidemia type - Plan: Lipid panel, Hemoglobin A1c  Hyperglycemia - Plan: Lipid panel, Hemoglobin A1c  Orders as above. Continue to push fluids, practice good hand hygiene, cover mouth when coughing. F/u prn. If starting to experience fevers, shaking, or shortness of breath, seek immediate care. Pt voiced understanding and agreement to the plan.  Jilda Rocheicholas Paul  [name], DO 10/30/17 10:26 AM

## 2017-10-30 NOTE — Progress Notes (Signed)
Pre visit review using our clinic review tool, if applicable. No additional management support is needed unless otherwise documented below in the visit note. 

## 2017-10-30 NOTE — Patient Instructions (Addendum)
Continue to push fluids, practice good hand hygiene, and cover your mouth if you cough.  If you start having fevers, shaking or shortness of breath, seek immediate care.  Consider using Flonase.   Let us know if you need anything.

## 2017-10-31 ENCOUNTER — Other Ambulatory Visit: Payer: Self-pay | Admitting: Family Medicine

## 2017-10-31 ENCOUNTER — Encounter: Payer: Self-pay | Admitting: Family Medicine

## 2017-10-31 MED ORDER — LIRAGLUTIDE -WEIGHT MANAGEMENT 18 MG/3ML ~~LOC~~ SOPN: 0.6000 mg | PEN_INJECTOR | Freq: Every day | SUBCUTANEOUS | 2 refills | Status: DC

## 2017-11-05 ENCOUNTER — Telehealth: Payer: Self-pay

## 2017-11-05 NOTE — Telephone Encounter (Signed)
PA request received- not initiated d/t Pt BMI of 27.0- for consideration BMI must be 30.0 or higher.

## 2017-11-05 NOTE — Telephone Encounter (Signed)
BMI is 27 w an overweight/obesity related comorbidity (hypertension). Could we try again or do they only accept that sole definition? I appreciate it Kaylyn.

## 2017-11-05 NOTE — Telephone Encounter (Signed)
The only consider those patients that have a BMI greater than 30.

## 2017-11-06 NOTE — Telephone Encounter (Signed)
Please inform pt. TY.  

## 2017-11-06 NOTE — Telephone Encounter (Signed)
Patient informed and will communicate on mychart with PCP

## 2017-11-13 ENCOUNTER — Other Ambulatory Visit: Payer: Self-pay | Admitting: Family Medicine

## 2017-11-13 DIAGNOSIS — F900 Attention-deficit hyperactivity disorder, predominantly inattentive type: Secondary | ICD-10-CM

## 2017-11-13 MED ORDER — AMPHETAMINE-DEXTROAMPHET ER 30 MG PO CP24: 30.0000 mg | ORAL_CAPSULE | ORAL | 0 refills | Status: DC

## 2017-11-13 MED FILL — ADDERALL XR 30 MG CAP SA: 30 | 30 days supply | Qty: 30 | Fill #0

## 2017-11-25 MED FILL — METOPROLOL SUCCINATE ER 50: 50 | 90 days supply | Qty: 90 | Fill #1

## 2017-11-25 MED FILL — AMLODIPINE BESYLATE 10 MG T: 10 | 90 days supply | Qty: 90 | Fill #1

## 2017-12-16 MED FILL — ADDERALL XR 30 MG CAP SA: 30 | 30 days supply | Qty: 30 | Fill #0

## 2017-12-19 ENCOUNTER — Encounter: Payer: Self-pay | Admitting: Family Medicine

## 2017-12-19 ENCOUNTER — Ambulatory Visit (INDEPENDENT_AMBULATORY_CARE_PROVIDER_SITE_OTHER): Payer: No Typology Code available for payment source | Admitting: Family Medicine

## 2017-12-19 VITALS — BP 122/78 | HR 93 | Temp 98.2°F | Ht 64.0 in | Wt 156.2 lb

## 2017-12-19 DIAGNOSIS — S1086XA Insect bite of other specified part of neck, initial encounter: Secondary | ICD-10-CM

## 2017-12-19 DIAGNOSIS — Z23 Encounter for immunization: Secondary | ICD-10-CM | POA: Diagnosis not present

## 2017-12-19 DIAGNOSIS — S50372A Other superficial bite of left elbow, initial encounter: Secondary | ICD-10-CM | POA: Diagnosis not present

## 2017-12-19 DIAGNOSIS — S20371A Other superficial bite of right front wall of thorax, initial encounter: Secondary | ICD-10-CM

## 2017-12-19 DIAGNOSIS — W57XXXA Bitten or stung by nonvenomous insect and other nonvenomous arthropods, initial encounter: Secondary | ICD-10-CM | POA: Diagnosis not present

## 2017-12-19 MED ORDER — TRIAMCINOLONE ACETONIDE 0.1 % EX CREA: 1.0000 "application " | TOPICAL_CREAM | Freq: Two times a day (BID) | CUTANEOUS | 0 refills | Status: DC

## 2017-12-19 MED ORDER — DOXYCYCLINE HYCLATE 100 MG PO TABS: 100.0000 mg | ORAL_TABLET | Freq: Two times a day (BID) | ORAL | 0 refills | Status: DC

## 2017-12-19 MED ORDER — ATORVASTATIN CALCIUM 40 MG PO TABS: 40.0000 mg | ORAL_TABLET | Freq: Every day | ORAL | 3 refills | Status: DC

## 2017-12-19 MED FILL — TRIAMCINOLONE 0.1% CREAM: 0.1 | 7 days supply | Qty: 30 | Fill #0

## 2017-12-19 MED FILL — ATORVASTATIN 40 MG TABLET: 40 | 90 days supply | Qty: 90 | Fill #0

## 2017-12-19 NOTE — Progress Notes (Signed)
Pre visit review using our clinic review tool, if applicable. No additional management support is needed unless otherwise documented below in the visit note. 

## 2017-12-19 NOTE — Progress Notes (Signed)
Chief Complaint  Patient presents with  . Insect Bite    Nichole Mcintosh is a 32 y.o. female here for a skin complaint.  Duration: 4 days Location: arm, chest, eye, neck Pruritic? Yes Painful? Yes Drainage? Yes New soaps/lotions/topicals/detergents? No Sick contacts? No Other associated symptoms: none Therapies tried thus far: Chigger cream, warm compress, INCS  ROS:  Const: No fevers Skin: As noted in HPI  Past Medical History:  Diagnosis Date  . Attention deficit hyperactivity disorder (ADHD) 11/03/2015  . Colitis   . Depression   . Diverticulitis   . GERD (gastroesophageal reflux disease)   . Headache   . Heart murmur    as a child   . Hypertension   . PONV (postoperative nausea and vomiting)    BP 122/78 (BP Location: Left Arm, Patient Position: Sitting, Cuff Size: Normal)   Pulse 93   Temp 98.2 F (36.8 C) (Oral)   Ht 5\' 4"  (1.626 m)   Wt 156 lb 4 oz (70.9 kg)   SpO2 98%   BMI 26.82 kg/m  Gen: awake, alert, appearing stated age Lungs: No accessory muscle use Skin: papules of erythema with central excoriation- 2 on L antecub fossa, one on posterior neck, one on ant R chest wall, small one on face hidden by makeup. No drainage, excessive warmth, TTP, fluctuance, excoriation Psych: Age appropriate judgment and insight  Bug bite, initial encounter - Plan: triamcinolone cream (KENALOG) 0.1 %, doxycycline (VIBRA-TABS) 100 MG tablet  Need for influenza vaccination - Plan: Flu Vaccine QUAD 6+ mos PF IM (Fluarix Quad PF)  TAO bid for 7-10 d, Kenalog cream for symptom relief. Try not to itch. 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  F/u prn. The patient voiced understanding and agreement to the plan.  Jilda Roche [place], DO 12/19/17 3:15 PM

## 2017-12-19 NOTE — Patient Instructions (Signed)
Apply mupirocin twice daily for 7-10 days.  Use the Kenalog before bed and during day to help with itching. Try not to itch.  Use Doxy if starting to have fevers or worsening symptoms.   Let us know if you need anything.

## 2018-01-16 ENCOUNTER — Ambulatory Visit (INDEPENDENT_AMBULATORY_CARE_PROVIDER_SITE_OTHER): Payer: Self-pay | Admitting: Family Medicine

## 2018-01-16 VITALS — BP 118/76 | HR 90 | Temp 98.9°F | Resp 18 | Wt 153.2 lb

## 2018-01-16 DIAGNOSIS — R52 Pain, unspecified: Secondary | ICD-10-CM

## 2018-01-16 DIAGNOSIS — J019 Acute sinusitis, unspecified: Secondary | ICD-10-CM

## 2018-01-16 DIAGNOSIS — J302 Other seasonal allergic rhinitis: Secondary | ICD-10-CM

## 2018-01-16 DIAGNOSIS — R6889 Other general symptoms and signs: Secondary | ICD-10-CM

## 2018-01-16 LAB — POCT INFLUENZA A/B
Influenza A, POC: NEGATIVE
Influenza B, POC: NEGATIVE

## 2018-01-16 MED ORDER — DOXYCYCLINE HYCLATE 100 MG PO TABS: 100.0000 mg | ORAL_TABLET | Freq: Two times a day (BID) | ORAL | 0 refills | Status: DC

## 2018-01-16 MED ORDER — PREDNISONE 20 MG PO TABS: 40.0000 mg | ORAL_TABLET | Freq: Every day | ORAL | 0 refills | Status: AC

## 2018-01-16 MED ORDER — AZELASTINE HCL 0.1 % NA SOLN: 1.0000 | Freq: Two times a day (BID) | NASAL | 0 refills | Status: DC

## 2018-01-16 NOTE — Patient Instructions (Addendum)

## 2018-01-16 NOTE — Progress Notes (Signed)
Nichole Mcintosh is a 32 y.o. female who presents today with concerns of progressive cough, cold and congestion symptoms. 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 She reports that she has attempted antipyretic fever reducer treat symptoms without much symptoms reduction.   Review of Systems  Constitutional: Positive for chills, fever and malaise/fatigue.  HENT: Positive for congestion. Negative for ear discharge, ear pain, sinus pain and sore throat.   Eyes: Negative.   Respiratory: Positive for cough. Negative for sputum production and shortness of breath.   Cardiovascular: Negative.  Negative for chest pain.  Gastrointestinal: Negative for abdominal pain, diarrhea, nausea and vomiting.  Genitourinary: Negative for dysuria, frequency, hematuria and urgency.  Musculoskeletal: Negative for myalgias.  Skin: Negative.   Neurological: Negative for headaches.  Endo/Heme/Allergies: Negative.   Psychiatric/Behavioral: Negative.     O: Vitals:   01/16/18 1653  BP: 118/76  Pulse: 90  Resp: 18  Temp: 98.9 F (37.2 C)  SpO2: 95%     Physical Exam  Constitutional: She is oriented to person, place, and time. Vital signs are normal. She appears well-developed and well-nourished. She is active.  Non-toxic appearance. She does not have a sickly appearance.  HENT:  Head: Normocephalic.  Right Ear: Hearing, external ear and ear canal normal. A middle ear effusion is present.  Left Ear: Hearing, external ear and ear canal normal. A middle ear effusion is present.  Nose: Mucosal edema and rhinorrhea present. Right sinus exhibits maxillary sinus tenderness and frontal sinus tenderness. Left sinus exhibits maxillary sinus tenderness and frontal sinus tenderness.  Mouth/Throat: Uvula is midline and oropharynx is clear and moist.  Neck: Normal range of motion. Neck supple.  Cardiovascular: Normal rate, regular rhythm, normal heart sounds and normal pulses.   Pulmonary/Chest: Effort normal and breath sounds normal.  Abdominal: Soft. Bowel sounds are normal.  Musculoskeletal: Normal range of motion.  Lymphadenopathy:       Head (right side): Tonsillar adenopathy present. No submental and no submandibular adenopathy present.       Head (left side): Tonsillar adenopathy present. No submental and no submandibular adenopathy present.    She has cervical adenopathy.       Right cervical: Superficial cervical adenopathy present.       Left cervical: Superficial cervical adenopathy present.  Neurological: She is alert and oriented to person, place, and time.  Psychiatric: She has a normal mood and affect.  Vitals reviewed.  A: 1. Body aches   2. Flu-like symptoms   3. Acute non-recurrent sinusitis, unspecified location   4. Seasonal allergies    P: Discussed exam findings, diagnosis etiology and medication use and indications reviewed with patient. Follow- Up and discharge instructions provided. No emergent/urgent issues found on exam.  Patient verbalized understanding of information provided and agrees with plan of care (POC), all questions answered.  1. Flu-like symptoms Results for orders placed or performed in visit on 01/16/18 (from the past 24 hour(s))  POCT Influenza A/B     Status: Normal   Collection Time: 01/16/18  5:38 PM  Result Value Ref Range   Influenza A, POC Negative Negative   Influenza B, POC Negative Negative    2. Acute non-recurrent sinusitis, unspecified location - doxycycline (VIBRA-TABS) 100 MG tablet; Take 1 tablet (100 mg total) by mouth 2 (two) times daily. - predniSONE (DELTASONE) 20 MG tablet; Take 2 tablets (40 mg total) by mouth daily with breakfast for 5 days.  3. Seasonal allergies -  azelastine (ASTELIN) 0.1 % nasal spray; Place 1 spray into both nostrils 2 (two) times daily. Use in each nostril as directed  4. Body aches - POCT Influenza A/B Results for orders placed or performed in visit on 01/16/18  (from the past 24 hour(s))  POCT Influenza A/B     Status: Normal   Collection Time: 01/16/18  5:38 PM  Result Value Ref Range   Influenza A, POC Negative Negative   Influenza B, POC Negative Negative

## 2018-01-19 MED FILL — FLUTICASONE PROP 50 MCG SPR: 50 | 30 days supply | Qty: 16 | Fill #1

## 2018-01-19 MED FILL — OMEPRAZOLE 40 MG CPDR: 40 | 90 days supply | Qty: 90 | Fill #1

## 2018-01-19 MED FILL — ADDERALL XR 30 MG CAP SA: 30 | 30 days supply | Qty: 30 | Fill #0

## 2018-02-22 ENCOUNTER — Other Ambulatory Visit: Payer: Self-pay | Admitting: Family Medicine

## 2018-02-22 DIAGNOSIS — J302 Other seasonal allergic rhinitis: Secondary | ICD-10-CM

## 2018-02-22 DIAGNOSIS — F900 Attention-deficit hyperactivity disorder, predominantly inattentive type: Secondary | ICD-10-CM

## 2018-02-23 MED ORDER — AMPHETAMINE-DEXTROAMPHET ER 30 MG PO CP24: 30.0000 mg | ORAL_CAPSULE | ORAL | 0 refills | Status: DC

## 2018-02-23 MED ORDER — AMPHETAMINE-DEXTROAMPHET ER 30 MG PO CP24: 30.0000 mg | ORAL_CAPSULE | Freq: Every day | ORAL | 0 refills | Status: DC

## 2018-02-23 MED FILL — ADDERALL XR 30 MG CAP SA: 30 | 30 days supply | Qty: 30 | Fill #0

## 2018-02-24 ENCOUNTER — Other Ambulatory Visit: Payer: Self-pay | Admitting: Family Medicine

## 2018-02-24 DIAGNOSIS — I10 Essential (primary) hypertension: Secondary | ICD-10-CM

## 2018-02-24 MED FILL — AMLODIPINE BESYLATE 10 MG T: 10 | 90 days supply | Qty: 90 | Fill #0

## 2018-02-24 MED FILL — METOPROLOL SUCCINATE ER 50: 50 | 90 days supply | Qty: 90 | Fill #0

## 2018-03-13 ENCOUNTER — Ambulatory Visit (INDEPENDENT_AMBULATORY_CARE_PROVIDER_SITE_OTHER): Payer: No Typology Code available for payment source | Admitting: Family Medicine

## 2018-03-13 ENCOUNTER — Encounter: Payer: Self-pay | Admitting: Family Medicine

## 2018-03-13 VITALS — BP 108/68 | HR 97 | Temp 98.2°F | Ht 64.0 in | Wt 152.1 lb

## 2018-03-13 DIAGNOSIS — J069 Acute upper respiratory infection, unspecified: Secondary | ICD-10-CM

## 2018-03-13 DIAGNOSIS — J302 Other seasonal allergic rhinitis: Secondary | ICD-10-CM | POA: Diagnosis not present

## 2018-03-13 DIAGNOSIS — H6593 Unspecified nonsuppurative otitis media, bilateral: Secondary | ICD-10-CM

## 2018-03-13 MED ORDER — AZELASTINE HCL 0.1 % NA SOLN: 1.0000 | Freq: Two times a day (BID) | NASAL | 1 refills | Status: DC

## 2018-03-13 MED ORDER — PREDNISONE 20 MG PO TABS: 40.0000 mg | ORAL_TABLET | Freq: Every day | ORAL | 0 refills | Status: DC

## 2018-03-13 MED FILL — AZELASTINE HCL 137 MCG/SPRA: 137 | 50 days supply | Qty: 30 | Fill #0

## 2018-03-13 MED FILL — predniSONE 20 MG TABS: 20 | 5 days supply | Qty: 10 | Fill #0

## 2018-03-13 NOTE — Progress Notes (Signed)
Chief Complaint  Patient presents with  . Ear Pain    congestion  . Sore Throat  . Headache    Shawntel Early here for URI complaints.  Duration: 5 days  Associated symptoms: subj fever, sinus headache, congestion, ST, ear pain and myalgia Denies: sinus pain, itchy watery eyes, ear drainage, wheezing and shortness of breath Treatment to date: Tylenol sinus and allergy, Mucinex Sick contacts: No  ROS:  Const: +subj fevers HEENT: As noted in HPI Lungs: No SOB  Past Medical History:  Diagnosis Date  . Attention deficit hyperactivity disorder (ADHD) 11/03/2015  . Colitis   . Depression   . Diverticulitis   . GERD (gastroesophageal reflux disease)   . Headache   . Heart murmur    as a child   . Hypertension   . PONV (postoperative nausea and vomiting)     BP 108/68 (BP Location: Left Arm, Patient Position: Sitting, Cuff Size: Normal)   Pulse 97   Temp 98.2 F (36.8 C) (Oral)   Ht 5\' 4"  (1.626 m)   Wt 152 lb 2 oz (69 kg)   SpO2 98%   BMI 26.11 kg/m  General: Awake, alert, appears stated age HEENT: AT, [place], ears patent b/l and TM mildly bulging with serous fluid on R, less so on L; there is no erythema or purulent material, nares patent w/o discharge, pharynx pink and without exudates, MMM Neck: No masses or asymmetry Heart: RRR Lungs: CTAB, no accessory muscle use Psych: Age appropriate judgment and insight, normal mood and affect  Upper respiratory tract infection, unspecified type  Seasonal allergies - Plan: azelastine (ASTELIN) 0.1 % nasal spray  Fluid level behind tympanic membrane of both ears - Plan: predniSONE (DELTASONE) 20 MG tablet  Orders as above. Continue to push fluids, practice good hand hygiene, cover mouth when coughing. F/u prn. If starting to experience fevers, shaking, or shortness of breath, seek immediate care. Pt voiced understanding and agreement to the plan.  Jilda Roche [place], DO 03/13/18 2:23 PM

## 2018-03-13 NOTE — Patient Instructions (Addendum)
Continue to push fluids, practice good hand hygiene, and cover your mouth if you cough.  If you start having fevers, shaking or shortness of breath, seek immediate care.  OK to take Tylenol 1000 mg (2 extra strength tabs) or 975 mg (3 regular strength tabs) every 6 hours as needed.  No NSAIDs while on prednisone.  Let us know if you need anything.

## 2018-03-13 NOTE — Progress Notes (Signed)
Pre visit review using our clinic review tool, if applicable. No additional management support is needed unless otherwise documented below in the visit note. 

## 2018-03-17 ENCOUNTER — Encounter: Payer: Self-pay | Admitting: Family Medicine

## 2018-03-18 ENCOUNTER — Other Ambulatory Visit: Payer: Self-pay | Admitting: Family Medicine

## 2018-03-18 MED ORDER — FLUCONAZOLE 150 MG PO TABS: 150.0000 mg | ORAL_TABLET | Freq: Once | ORAL | 0 refills | Status: AC

## 2018-03-18 MED ORDER — AZITHROMYCIN 250 MG PO TABS: ORAL_TABLET | ORAL | 0 refills | Status: DC

## 2018-04-02 MED FILL — ADDERALL XR 30 MG CAP SA: 30 | 30 days supply | Qty: 30 | Fill #0

## 2018-04-02 MED FILL — ATORVASTATIN 40 MG TABLET: 40 | 90 days supply | Qty: 90 | Fill #1

## 2018-04-20 ENCOUNTER — Telehealth: Payer: Self-pay | Admitting: Family Medicine

## 2018-04-20 MED ORDER — NICOTINE 21 MG/24HR TD PT24: 21.0000 mg | MEDICATED_PATCH | Freq: Every day | TRANSDERMAL | 0 refills | Status: AC

## 2018-04-20 MED ORDER — NICOTINE 14 MG/24HR TD PT24: 14.0000 mg | MEDICATED_PATCH | Freq: Every day | TRANSDERMAL | 0 refills | Status: AC

## 2018-04-20 MED ORDER — NICOTINE 7 MG/24HR TD PT24: 7.0000 mg | MEDICATED_PATCH | Freq: Every day | TRANSDERMAL | 0 refills | Status: DC

## 2018-04-20 MED FILL — NICOTINE 21 MG/24HR PATCH: 21 | 42 days supply | Qty: 42 | Fill #0

## 2018-04-20 NOTE — Telephone Encounter (Signed)
Copied from CRM [id number]. Topic: Quick Communication - Rx Refill/Question >> Apr 20, 2018  3:04 PM Nichole Mcintosh wrote: Medication: Nicotine Patch  Pt called and asked if Dr. Carmelia Roller can call in Nicotine patches for her and her husband [name] DOB 04.04.1983)so they can stop smoking/ please advise   Preferred Pharmacy (with phone number or street name):   Agent: Please be advised that RX refills may take up to 3 business days. We ask that you follow-up with your pharmacy.

## 2018-05-02 ENCOUNTER — Ambulatory Visit (INDEPENDENT_AMBULATORY_CARE_PROVIDER_SITE_OTHER): Payer: No Typology Code available for payment source | Admitting: Family Medicine

## 2018-05-02 ENCOUNTER — Encounter: Payer: Self-pay | Admitting: Family Medicine

## 2018-05-02 VITALS — BP 112/68 | HR 88 | Temp 98.0°F | Ht 64.0 in | Wt 153.0 lb

## 2018-05-02 DIAGNOSIS — R1032 Left lower quadrant pain: Secondary | ICD-10-CM

## 2018-05-02 DIAGNOSIS — B373 Candidiasis of vulva and vagina: Secondary | ICD-10-CM

## 2018-05-02 DIAGNOSIS — K5792 Diverticulitis of intestine, part unspecified, without perforation or abscess without bleeding: Secondary | ICD-10-CM | POA: Diagnosis not present

## 2018-05-02 DIAGNOSIS — B3731 Acute candidiasis of vulva and vagina: Secondary | ICD-10-CM

## 2018-05-02 LAB — POC URINALSYSI DIPSTICK (AUTOMATED)
Glucose, UA: NEGATIVE
Ketones, UA: NEGATIVE
Leukocytes, UA: NEGATIVE
Nitrite, UA: NEGATIVE
Protein, UA: NEGATIVE
Spec Grav, UA: >=1.03 — AB (ref 1.010–1.025)
Urobilinogen, UA: 0.2 E.U./dL
pH, UA: 6 (ref 5.0–8.0)

## 2018-05-02 LAB — POCT URINE PREGNANCY: Preg Test, Ur: NEGATIVE

## 2018-05-02 MED ORDER — AMOXICILLIN-POT CLAVULANATE 875-125 MG PO TABS: 1.0000 | ORAL_TABLET | Freq: Three times a day (TID) | ORAL | 0 refills | Status: DC

## 2018-05-02 MED ORDER — FLUCONAZOLE 150 MG PO TABS: ORAL_TABLET | ORAL | 0 refills | Status: DC

## 2018-05-02 MED FILL — AMOX-CLAV 875-125 MG TABLET: 875-125 | 10 days supply | Qty: 30 | Fill #0

## 2018-05-02 MED FILL — FLUCONAZOLE 150 MG TABS: 150 | 4 days supply | Qty: 2 | Fill #0

## 2018-05-02 NOTE — Patient Instructions (Signed)
Follow a clear liquid diet for the next 24 hours, add in some mild bland foods tomorrow and slowly advance diet as tolerated.  Take Augmentin 3 times a day for 10 days to treat diverticulitis, Diflucan sent in to use if needed for yeast infection development.  Continue probiotic as you have been taking.  If your pain worsens, do not hesitate to call the on-call and or go to ER for evaluation due to your past history

## 2018-05-02 NOTE — Progress Notes (Signed)
Subjective:    Patient ID: Nichole Mcintosh, female    DOB: 1985-12-07, 33 y.o.   MRN: [id number]  HPI   Patient presents to clinic complaining of left-sided abdominal pain that began yesterday.  Patient has a history of diverticulitis and abdominal surgery.  Patient is concerned about the pain due to the past history of needing a partial colectomy related to a past severe diverticulitis infection.  Patient states she has had some diarrhea, some soft stools.  Denies any nausea or vomiting.  Denies fever or chills.  Does not feel she ate anything out of the ordinary, but did have strawberries yesterday and sometimes foods but smaller seeds seem to set her off.  Currently on the end of her menstrual cycle.   Patient Active Problem List   Diagnosis Date Noted  . 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 LLQ abdominal pain   . Acute diverticulitis   . Constipation   . Hypokalemia 06/16/2016  . Colitis 06/15/2016  . GAD (generalized anxiety disorder) 05/20/2016  . Essential hypertension 12/08/2015  . Attention deficit hyperactivity disorder (ADHD) 11/03/2015   Social History   Tobacco Use  . Smoking status: Current Every Day Smoker    Packs/day: 0.50    Types: Cigarettes  . Smokeless tobacco: Never Used  Substance Use Topics  . Alcohol use: Yes    Comment: rare   Past Surgical History:  Procedure Laterality Date  . COLON SURGERY  2011   Partial colectomy, diverticulitis  . COLONOSCOPY WITH PROPOFOL N/A 08/01/2016   Procedure: COLONOSCOPY WITH PROPOFOL;  Surgeon: Sherrilyn Rist, MD;  Location: [facility];  Service: Gastroenterology;  Laterality: N/A;  . TUBAL LIGATION  2004    Review of Systems  Constitutional: Negative for chills, fatigue and fever.  HENT: Negative for congestion, ear pain, sinus pain and sore throat.   Eyes: Negative.   Respiratory: Negative for cough, shortness of breath and wheezing.   Cardiovascular: Negative for chest pain,  palpitations and leg swelling.  Gastrointestinal: +left ABD pain, some diarrhea. No vomiting.  Genitourinary: Negative for dysuria, frequency and urgency.  Musculoskeletal: Negative for arthralgias and myalgias.  Skin: Negative for color change, pallor and rash.  Neurological: Negative for syncope, light-headedness and headaches.  Psychiatric/Behavioral: The patient is not nervous/anxious.       Objective:   Physical Exam Vitals signs and nursing note reviewed.  Constitutional:      General: She is not in acute distress.    Appearance: She is not ill-appearing or toxic-appearing.  HENT:     Head: Normocephalic and atraumatic.     Mouth/Throat:     Mouth: Mucous membranes are moist.  Eyes:     General: No scleral icterus.    Extraocular Movements: Extraocular movements intact.     Pupils: Pupils are equal, round, and reactive to light.  Cardiovascular:     Rate and Rhythm: Normal rate and regular rhythm.  Pulmonary:     Effort: Pulmonary effort is normal. No respiratory distress.     Breath sounds: Normal breath sounds.  Abdominal:     General: Bowel sounds are normal. There is no distension.     Palpations: Abdomen is soft.     Tenderness: There is abdominal tenderness in the left lower quadrant.  Neurological:     Mental Status: She is alert and oriented to person, place, and time.  Psychiatric:        Mood and Affect: Mood normal.  Behavior: Behavior normal.     Today's Vitals   05/02/18 1050  BP: 112/68  Pulse: 88  Temp: 98 F (36.7 C)  TempSrc: Oral  SpO2: 98%  Weight: 153 lb (69.4 kg)  Height: 5\' 4"  (1.626 m)   Body mass index is 26.26 kg/m.     Assessment & Plan:    Left-sided abdominal pain, diverticulitis suspected - due to patient's left lower quadrant tenderness on exam, diverticulitis history we will treat with Augmentin 3 times daily for 10 days.  Advised to continue taking probiotic daily as she already does.  Patient encouraged to follow a  clear liquid diet for the next 24 hours and then slowly add in some bland foods over the next few days and advance diet as tolerated.  Patient states she often gets a vaginal yeast infection with having to take an antibiotic, so Diflucan sent in to use if yeast infection does develop.  Patient advised to keep up good water intake.  Strict return precautions given, patient advised that if pain worsens, develops severe diarrhea, vomiting, fever/chills to call on-call and or go to emergency room right away for evaluation.  Patient verbalizes understanding.  She will keep regularly scheduled follow-up with PCP as planned and return to clinic sooner if any issues arise.

## 2018-05-04 ENCOUNTER — Telehealth: Payer: Self-pay | Admitting: Family Medicine

## 2018-05-04 MED FILL — OMEPRAZOLE 40 MG CPDR: 40 | 90 days supply | Qty: 90 | Fill #2

## 2018-05-04 MED FILL — ADDERALL XR 30 MG CAP SA: 30 | 30 days supply | Qty: 30 | Fill #0

## 2018-05-04 NOTE — Telephone Encounter (Signed)
Patient was given Augmentin 875 and instructions to take three times daily//pharmacy ([facility]) requested to clarify normallly it is twice daily?

## 2018-05-04 NOTE — Telephone Encounter (Signed)
Pharmacy clarified.

## 2018-05-04 NOTE — Telephone Encounter (Signed)
I did not write it, but for what it was written for according to the note, TID is correct. TY.

## 2018-06-10 ENCOUNTER — Other Ambulatory Visit: Payer: Self-pay | Admitting: Family Medicine

## 2018-06-10 DIAGNOSIS — F900 Attention-deficit hyperactivity disorder, predominantly inattentive type: Secondary | ICD-10-CM

## 2018-06-10 MED ORDER — AMPHETAMINE-DEXTROAMPHET ER 30 MG PO CP24: 30.0000 mg | ORAL_CAPSULE | Freq: Every day | ORAL | 0 refills | Status: DC

## 2018-06-10 MED FILL — METOPROLOL SUCCINATE ER 50: 50 | 90 days supply | Qty: 90 | Fill #0

## 2018-06-10 MED FILL — ADDERALL XR 30 MG CAP SA: 30 | 30 days supply | Qty: 30 | Fill #0

## 2018-06-10 MED FILL — AMLODIPINE BESYLATE 10 MG T: 10 | 90 days supply | Qty: 90 | Fill #0

## 2018-06-10 MED FILL — ATORVASTATIN 40 MG TABLET: 40 | 90 days supply | Qty: 90 | Fill #0

## 2018-06-10 NOTE — Telephone Encounter (Signed)
Evisit

## 2018-07-17 ENCOUNTER — Other Ambulatory Visit: Payer: Self-pay | Admitting: Family Medicine

## 2018-07-17 DIAGNOSIS — F900 Attention-deficit hyperactivity disorder, predominantly inattentive type: Secondary | ICD-10-CM

## 2018-07-17 MED FILL — ADDERALL XR 30 MG CAP SA: 30 | 30 days supply | Qty: 30 | Fill #0

## 2018-07-17 NOTE — Telephone Encounter (Signed)
I sent in a 3 mo supply around 1 mo ago, is it not at pharmacy?

## 2018-07-17 NOTE — Telephone Encounter (Signed)
Patient was first given the incorrect info at pharmacy, but has been corrected and she does have refills.

## 2018-08-19 ENCOUNTER — Other Ambulatory Visit: Payer: Self-pay | Admitting: Family Medicine

## 2018-08-19 MED FILL — ADDERALL XR 30 MG CAP SA: 30 | 30 days supply | Qty: 30 | Fill #0

## 2018-08-19 MED FILL — OMEPRAZOLE 40 MG CPDR: 40 | 90 days supply | Qty: 90 | Fill #0

## 2018-09-05 ENCOUNTER — Telehealth: Payer: No Typology Code available for payment source | Admitting: Family

## 2018-09-05 ENCOUNTER — Encounter: Payer: Self-pay | Admitting: Family

## 2018-09-05 DIAGNOSIS — R509 Fever, unspecified: Secondary | ICD-10-CM | POA: Diagnosis not present

## 2018-09-05 DIAGNOSIS — K5792 Diverticulitis of intestine, part unspecified, without perforation or abscess without bleeding: Secondary | ICD-10-CM

## 2018-09-05 MED ORDER — CIPROFLOXACIN HCL 500 MG PO TABS: 500.0000 mg | ORAL_TABLET | Freq: Two times a day (BID) | ORAL | 0 refills | Status: DC

## 2018-09-05 MED ORDER — PROMETHAZINE HCL 25 MG PO TABS: 25.0000 mg | ORAL_TABLET | Freq: Three times a day (TID) | ORAL | 0 refills | Status: DC | PRN

## 2018-09-05 NOTE — Progress Notes (Signed)
Based on what you shared with me, I feel your condition warrants further evaluation and I recommend that you be seen for a face to face office visit.  NOTE: If you entered your credit card information for this eVisit, you will not be charged. You may see a "hold" on your card for the $35 but that hold will drop off and you will not have a charge processed.  If you are having a true medical emergency please call 911.     For an urgent face to face visit, [facility] has five urgent care centers for your convenience:    [email or web address] to reserve your spot online an avoid wait times  [facility] [address], [address] [place], [place] [address] Modified hours of operation: Monday-Friday, 12 PM to 6 PM  Closed Saturday & Sunday  *Across the street from [employer]  [facility] (New Address!) [address], [address] [place], [place] [address] *Just off [address], across the road from [employer]* Modified hours of operation: Monday-Friday, 12 PM to 6 PM  Closed Saturday & Sunday   The following sites will take your insurance:  . [facility]    [phone]                  Get Driving Directions  [address] [place], [place] [address] . 10 am to 8 pm Monday-Friday . 12 pm to 8 pm Saturday-Sunday   . [facility]  [phone]                  Get Driving Directions  [address], [address] [place], [place] [address] . 8 am to 8 pm Monday-Friday . 9 am to 6 pm Saturday . 11 am to 6 pm Sunday   . [facility]  [phone]                  Get Driving Directions   [address] [place], [place] [address] . 8 am to 8 pm Monday-Friday . 8 am to 4 pm Saturday-Sunday    . [facility]                    Get Driving Directions  [phone]  [address] [place], [place] [address]   . Monday-Friday, 12 PM to 6 PM    Your e-visit answers were reviewed by a board certified advanced clinical practitioner to complete your personal care plan.  Thank you for using e-Visits. 

## 2018-09-05 NOTE — Addendum Note (Signed)
Addended by: Dutch Quint B on: 09/05/2018 02:03 PM   Modules accepted: Orders

## 2018-09-21 ENCOUNTER — Other Ambulatory Visit: Payer: Self-pay | Admitting: Family Medicine

## 2018-09-21 DIAGNOSIS — B3731 Acute candidiasis of vulva and vagina: Secondary | ICD-10-CM

## 2018-09-21 DIAGNOSIS — B373 Candidiasis of vulva and vagina: Secondary | ICD-10-CM

## 2018-09-21 DIAGNOSIS — F900 Attention-deficit hyperactivity disorder, predominantly inattentive type: Secondary | ICD-10-CM

## 2018-09-21 MED ORDER — AMPHETAMINE-DEXTROAMPHET ER 30 MG PO CP24: 30.0000 mg | ORAL_CAPSULE | Freq: Every day | ORAL | 0 refills | Status: DC

## 2018-09-21 MED ORDER — FLUCONAZOLE 150 MG PO TABS: ORAL_TABLET | ORAL | 0 refills | Status: DC

## 2018-09-21 MED FILL — FLUCONAZOLE 150 MG TABS: 150 | 4 days supply | Qty: 2 | Fill #0

## 2018-09-21 MED FILL — ADDERALL XR 30 MG CAP SA: 30 | 30 days supply | Qty: 30 | Fill #0

## 2018-09-21 NOTE — Telephone Encounter (Signed)
Due for 6 mo visit/CPE. Ty.

## 2018-09-21 NOTE — Telephone Encounter (Signed)
Patient informed of PCP instructions. She will call back to schedule

## 2018-10-02 ENCOUNTER — Ambulatory Visit (HOSPITAL_BASED_OUTPATIENT_CLINIC_OR_DEPARTMENT_OTHER)
Admission: RE | Admit: 2018-10-02 | Discharge: 2018-10-02 | Disposition: A | Discharge status: Home / Self Care | Payer: No Typology Code available for payment source | Source: Ambulatory Visit | Attending: Medical | Admitting: Medical

## 2018-10-02 ENCOUNTER — Other Ambulatory Visit: Payer: Self-pay

## 2018-10-02 ENCOUNTER — Ambulatory Visit (INDEPENDENT_AMBULATORY_CARE_PROVIDER_SITE_OTHER): Payer: No Typology Code available for payment source | Admitting: Medical

## 2018-10-02 VITALS — BP 126/81 | HR 81 | Temp 98.1°F | Resp 16 | Ht 64.0 in | Wt 151.0 lb

## 2018-10-02 DIAGNOSIS — M25531 Pain in right wrist: Secondary | ICD-10-CM | POA: Diagnosis not present

## 2018-10-02 DIAGNOSIS — M2550 Pain in unspecified joint: Secondary | ICD-10-CM

## 2018-10-02 DIAGNOSIS — M25571 Pain in right ankle and joints of right foot: Secondary | ICD-10-CM

## 2018-10-02 DIAGNOSIS — M79641 Pain in right hand: Secondary | ICD-10-CM | POA: Insufficient documentation

## 2018-10-02 DIAGNOSIS — M25532 Pain in left wrist: Secondary | ICD-10-CM

## 2018-10-02 DIAGNOSIS — M25572 Pain in left ankle and joints of left foot: Secondary | ICD-10-CM | POA: Diagnosis not present

## 2018-10-02 LAB — CBC WITH DIFFERENTIAL/PLATELET
Basophils Absolute: 0 10*3/uL (ref 0.0–0.1)
Basophils Relative: 0.3 % (ref 0.0–3.0)
Eosinophils Absolute: 0.1 10*3/uL (ref 0.0–0.7)
Eosinophils Relative: 0.6 % (ref 0.0–5.0)
HCT: 44.1 % (ref 36.0–46.0)
Hemoglobin: 14.7 g/dL (ref 12.0–15.0)
Lymphocytes Relative: 19 % (ref 12.0–46.0)
Lymphs Abs: 1.9 10*3/uL (ref 0.7–4.0)
MCHC: 33.3 g/dL (ref 30.0–36.0)
MCV: 93.5 fl (ref 78.0–100.0)
Monocytes Absolute: 0.6 10*3/uL (ref 0.1–1.0)
Monocytes Relative: 6.6 % (ref 3.0–12.0)
Neutro Abs: 7.2 10*3/uL (ref 1.4–7.7)
Neutrophils Relative %: 73.5 % (ref 43.0–77.0)
Platelets: 289 10*3/uL (ref 150.0–400.0)
RBC: 4.72 Mil/uL (ref 3.87–5.11)
RDW: 14.3 % (ref 11.5–15.5)
WBC: 9.8 10*3/uL (ref 4.0–10.5)

## 2018-10-02 LAB — SEDIMENTATION RATE: Sed Rate: 7 mm/hr (ref 0–20)

## 2018-10-02 LAB — URIC ACID: Uric Acid, Serum: 4 mg/dL (ref 2.4–7.0)

## 2018-10-02 LAB — C-REACTIVE PROTEIN: CRP: 3.3 mg/dL (ref 0.5–20.0)

## 2018-10-02 MED ORDER — PREDNISONE 10 MG PO TABS: ORAL_TABLET | ORAL | 0 refills | Status: DC

## 2018-10-02 MED FILL — predniSONE 10 MG TABS: 10 | 6 days supply | Qty: 21 | Fill #0

## 2018-10-02 NOTE — Progress Notes (Signed)
Subjective:    Patient ID: Nichole AbedSamantha Rafferty, female    DOB: August 08, 1985, 33 y.o.   MRN: [id number]  HPI  Pt states she has acute onset of swollen hand, feet, knees, and elbows. Pt was in car traveling to [place] to move daughter in. Joints started and when got to [place] felt stiff when got out of car. This contnued but then this morning joints swollen and pain. No fever, no chills or sweats. Pt denies any tick bites. Pt states 5-6 years ago she had some joint pain in past when went to ED. Hands were not as stiff or painful back then. Pt states in ED she got prednisone and within 4-5 days joint pains resolve. No st, and no swollen tonsils. No skin rashes.   Mostly has wrist knees and ankle pain. Joints feel warm. Mild elbow and shoulder pain.  lmp- September 12, 2018.  Review of Systems  Constitutional: Negative for chills and fatigue.  HENT: Negative for congestion, ear discharge, ear pain, postnasal drip, sinus pressure and sore throat.   Respiratory: Negative for cough, chest tightness and wheezing.   Cardiovascular: Negative for chest pain and palpitations.  Gastrointestinal: Negative for abdominal pain, constipation and nausea.  Genitourinary: Negative for dysuria.  Musculoskeletal: Positive for joint swelling. Negative for back pain, myalgias and neck pain.  Skin: Negative for rash.  Neurological: Negative for dizziness, speech difficulty, weakness, numbness and headaches.  Hematological: Negative for adenopathy. Does not bruise/bleed easily.  Psychiatric/Behavioral: Negative for behavioral problems, decreased concentration and suicidal ideas. The patient is not nervous/anxious.    Past Medical History:  Diagnosis Date  . Attention deficit hyperactivity disorder (ADHD) 11/03/2015  . Colitis   . Depression   . Diverticulitis   . GERD (gastroesophageal reflux disease)   . Headache   . Heart murmur    as a child   . Hypertension   . PONV (postoperative nausea and vomiting)      Social  History   Socioeconomic History  . Marital status: Married    Spouse name: Not on file  . Number of children: Not on file  . Years of education: Not on file  . Highest education level: Not on file  Occupational History  . Occupation: nurse  Social Needs  . Financial resource strain: Not on file  . Food insecurity    Worry: Not on file    Inability: Not on file  . Transportation needs    Medical: Not on file    Non-medical: Not on file  Tobacco Use  . Smoking status: Current Every Day Smoker    Packs/day: 0.50    Types: Cigarettes  . Smokeless tobacco: Never Used  Substance and Sexual Activity  . Alcohol use: Yes    Comment: rare  . Drug use: No  . Sexual activity: Yes    Comment: tubal  Lifestyle  . Physical activity    Days per week: Not on file    Minutes per session: Not on file  . Stress: Not on file  Relationships  . Social Musicianconnections    Talks on phone: Not on file    Gets together: Not on file    Attends religious service: Not on file    Active member of club or organization: Not on file    Attends meetings of clubs or organizations: Not on file    Relationship status: Not on file  . Intimate partner violence    Fear of current or ex partner: Not on  file    Emotionally abused: Not on file    Physically abused: Not on file    Forced sexual activity: Not on file  Other Topics Concern  . Not on file  Social History Narrative  . Not on file    Past Surgical History:  Procedure Laterality Date  . COLON SURGERY  2011   Partial colectomy, diverticulitis  . COLONOSCOPY WITH PROPOFOL N/A 08/01/2016   Procedure: COLONOSCOPY WITH PROPOFOL;  Surgeon: Sherrilyn Ristanis, Henry L III, MD;  Location: [facility];  Service: Gastroenterology;  Laterality: N/A;  . TUBAL LIGATION  2004    Family History  Problem Relation Age of Onset  . Hypertension Mother   . Colon polyps Mother   . Heart disease Maternal Grandmother   . Lung cancer Maternal Grandmother        Lung  .  Heart disease Maternal Grandfather   . Colon cancer Neg Hx   . Stomach cancer Neg Hx     No Known Allergies  Current Outpatient Medications on File Prior to Visit  Medication Sig Dispense Refill  . amLODipine (NORVASC) 10 MG tablet TAKE 1 TABLET BY MOUTH ONCE DAILY 90 tablet 1  . amphetamine-dextroamphetamine (ADDERALL XR) 30 MG 24 hr capsule Take 1 capsule (30 mg total) by mouth daily. 30 capsule 0  . amphetamine-dextroamphetamine (ADDERALL XR) 30 MG 24 hr capsule Take 1 capsule (30 mg total) by mouth daily. 30 capsule 0  . amphetamine-dextroamphetamine (ADDERALL XR) 30 MG 24 hr capsule Take 1 capsule (30 mg total) by mouth daily. 30 capsule 0  . atorvastatin (LIPITOR) 40 MG tablet Take 1 tablet (40 mg total) by mouth daily. 90 tablet 3  . cetirizine (ZYRTEC) 10 MG tablet Take 10 mg by mouth daily.    . ciprofloxacin (CIPRO) 500 MG tablet Take 1 tablet (500 mg total) by mouth 2 (two) times daily. 20 tablet 0  . fluconazole (DIFLUCAN) 150 MG tablet Take 1st tablet on day 1, take second tablet on day 4 2 tablet 0  . fluticasone (FLONASE) 50 MCG/ACT nasal spray Place 2 sprays into both nostrils daily. 16 g 2  . metoprolol succinate (TOPROL-XL) 50 MG 24 hr tablet TAKE 1 TABLET (50 MG TOTAL) BY MOUTH DAILY. TAKE WITH OR IMMEDIATELY FOLLOWING A MEAL. 90 tablet 1  . nicotine (NICODERM CQ - DOSED IN MG/24 HR) 7 mg/24hr patch Place 1 patch (7 mg total) onto the skin daily. 28 patch 0  . omeprazole (PRILOSEC) 40 MG capsule TAKE 1 CAPSULE BY MOUTH DAILY 90 capsule 2  . promethazine (PHENERGAN) 25 MG tablet Take 1 tablet (25 mg total) by mouth every 8 (eight) hours as needed for nausea or vomiting. 20 tablet 0   No current facility-administered medications on file prior to visit.     BP 126/81   Pulse 81   Temp 98.1 F (36.7 C) (Oral)   Resp 16   Ht 5\' 4"  (1.626 m)   Wt 151 lb (68.5 kg)   SpO2 100%   BMI 25.92 kg/m       Objective:   Physical Exam  General- No acute distress. Pleasant  patient. Neck- Full range of motion, no jvd. No swollen lymph nodes. Lungs- Clear, even and unlabored. Heart- regular rate and rhythm. Neurologic- CNII- XII grossly intact. Throat- mid large tonsils but these are baseline per pt. No dc.   Ankle-both are swollen. Rt side is more swollen than left side. Knees- both mild swolllen and mild warm. Pain on flexion  and ext. Worse on rt side. Hands and wrist moderate swelllon- 3rd digit mcp joints worse swolllen.  Elbows- not swollen. Mild pain on rom. Shoulders- not swollen, mild pain on rom.      Assessment & Plan:  You do have recent diffuse arthralgias over the last 24 hours and you do have a remote history of this happening 5 to 6 years ago.  You indicate quick resolution of symptoms when you were given prednisone and were advised to get arthritis panel studies.  Now with recurrent similar symptoms.  I do think is a good idea to get RA factor, sed rate, ANA, CBC and uric acid.  Discussion regarding doing tick bite studies but due to no history of tick bite decided not to do that presently.  This might need to be done on expanded work-up.  Also considered infection such as strep throat associated with above arthralgias but by exam and recent history I do not think strep studies indicated.  We will see how you respond to treatment and follow-up.  Also decided to get x-ray of right hand since that seems to be worse area of pain and inflammation.  Follow-up in 7 days with PCP or as needed.  25 minutes spent with patient.  50% of time spent counseling patient on plan going forward and potential differential diagnoses.  Mackie Pai, PA-C

## 2018-10-02 NOTE — Patient Instructions (Addendum)
You do have recent diffuse arthralgias over the last 24 hours and you do have a remote history of this happening 5 to 6 years ago.  You indicate quick resolution of symptoms when you were given prednisone and were advised to get arthritis panel studies.  Now with recurrent similar symptoms.  I do think is a good idea to get RA factor, sed rate, ANA, CBC and uric acid.  Discussion regarding doing tick bite studies but due to no history of tick bite decided not to do that presently.  This might need to be done on expanded work-up.  Also considered infection such as strep throat associated with above arthralgias but by exam and recent history I do not think strep studies indicated.  We will see how you respond to treatment and follow-up.  Also decided to get x-ray of right hand since that seems to be worse area of pain and inflammation.  Follow-up in 7 days with PCP or as needed.

## 2018-10-03 ENCOUNTER — Encounter: Payer: Self-pay | Admitting: Medical

## 2018-10-05 ENCOUNTER — Encounter: Payer: Self-pay | Admitting: Medical

## 2018-10-05 LAB — ANA: Anti Nuclear Antibody (ANA): NEGATIVE

## 2018-10-05 LAB — RHEUMATOID FACTOR: Rhuematoid fact SerPl-aCnc: <14 IU/mL (ref <14)

## 2018-10-09 ENCOUNTER — Encounter: Payer: Self-pay | Admitting: Family Medicine

## 2018-10-09 ENCOUNTER — Other Ambulatory Visit: Payer: Self-pay

## 2018-10-09 ENCOUNTER — Ambulatory Visit (INDEPENDENT_AMBULATORY_CARE_PROVIDER_SITE_OTHER): Payer: No Typology Code available for payment source | Admitting: Family Medicine

## 2018-10-09 VITALS — BP 124/82 | HR 103 | Temp 98.1°F | Ht 64.0 in | Wt 154.0 lb

## 2018-10-09 DIAGNOSIS — M2550 Pain in unspecified joint: Secondary | ICD-10-CM | POA: Diagnosis not present

## 2018-10-09 MED ORDER — PREDNISONE 20 MG PO TABS: 40.0000 mg | ORAL_TABLET | Freq: Every day | ORAL | 0 refills | Status: AC

## 2018-10-09 MED FILL — predniSONE 20 MG TABS: 20 | 5 days supply | Qty: 10 | Fill #0

## 2018-10-09 NOTE — Progress Notes (Signed)
Musculoskeletal Exam  Patient: Nichole Mcintosh DOB: Dec 30, 1985  DOS: 10/09/2018  SUBJECTIVE:  Chief Complaint:   Chief Complaint  Patient presents with  . Follow-up    Nichole Mcintosh is a 33 y.o.  female for evaluation and treatment of b/l hand pain.   Onset:  1 week ago. No inj or change in activity.  Location:  Character:  aching  Progression of issue:  Had gotten better on steroid taper, came back a few days ago Associated symptoms: swelling Treatment: to date has been oral steroids, Tylenol, Motrin  Neurovascular symptoms: no  ROS: Musculoskeletal/Extremities: +hand pain Skin: No rash  Past Medical History:  Diagnosis Date  . Attention deficit hyperactivity disorder (ADHD) 11/03/2015  . Colitis   . Depression   . Diverticulitis   . GERD (gastroesophageal reflux disease)   . Headache   . Heart murmur    as a child   . Hypertension   . PONV (postoperative nausea and vomiting)     Objective: VITAL SIGNS: BP 124/82 (BP Location: Left Arm, Patient Position: Sitting, Cuff Size: Normal)   Pulse (!) 103   Temp 98.1 F (36.7 C) (Oral)   Ht 5\' 4"  (1.626 m)   Wt 154 lb (69.9 kg)   LMP 09/12/2018   SpO2 98%   BMI 26.43 kg/m  Constitutional: Well formed, well developed. No acute distress. Cardiovascular: Brisk cap refill Thorax & Lungs: No accessory muscle use Musculoskeletal: hands.   Normal active range of motion: yes.   Normal passive range of motion: yes Tenderness to palpation: mild ttp over PIP 3-5 b/l, MCP 3-5 Deformity: no Ecchymosis: no Mild edema of these joints Neurologic: Normal sensory function. No focal deficits noted. Skin: some white patches on nails, no pitting, no erythema/scaling post auricular.  Psychiatric: Normal mood. Age appropriate judgment and insight. Alert & oriented x 3.    Assessment:  Arthralgia, unspecified joint - Plan: Cyclic citrul peptide antibody, IgG (QUEST), Anti-DNA antibody, double-stranded, Sjogrens syndrome-A extractable  nuclear antibody, Sjogrens syndrome-B extractable nuclear antibody, Anti-Smith antibody, I question seroneg inflammatory arthritis. Will do a drug holiday drom statin.   Plan: Orders as above. F/u as originally scheduled for CPE and will reck there.  The patient voiced understanding and agreement to the plan.   Weir, DO 10/09/18  1:48 PM

## 2018-10-09 NOTE — Patient Instructions (Addendum)
Take 1 tab of the prednisone daily. You can increase to 1.5 tabs or 2 tabs daily as needed.  Give [place] 2-3 business days to get the results of your labs back.   Ice/cold pack over area for 10-15 min twice daily.  OK to take Tylenol 1000 mg (2 extra strength tabs) or 975 mg (3 regular strength tabs) every 6 hours as needed.  Stop the Lipitor for 1 month. Consider CoQ10.   Let us know if you need anything.

## 2018-10-14 ENCOUNTER — Encounter: Payer: No Typology Code available for payment source | Admitting: Family Medicine

## 2018-10-20 ENCOUNTER — Other Ambulatory Visit: Payer: Self-pay | Admitting: Family Medicine

## 2018-10-20 DIAGNOSIS — F900 Attention-deficit hyperactivity disorder, predominantly inattentive type: Secondary | ICD-10-CM

## 2018-10-20 MED ORDER — AMPHETAMINE-DEXTROAMPHET ER 30 MG PO CP24: 30.0000 mg | ORAL_CAPSULE | Freq: Every day | ORAL | 0 refills | Status: DC

## 2018-10-20 MED FILL — ADDERALL XR 30 MG CAP SA: 30 | 30 days supply | Qty: 30 | Fill #0

## 2018-10-26 ENCOUNTER — Telehealth: Payer: Self-pay | Admitting: Family Medicine

## 2018-10-26 ENCOUNTER — Encounter: Payer: No Typology Code available for payment source | Admitting: Family Medicine

## 2018-10-26 NOTE — Telephone Encounter (Signed)
Pt canceled appt today at 6:48 am by phone- did not leave reason why canceling appt. Provider wants pt to be charged no show fee.

## 2018-11-20 ENCOUNTER — Other Ambulatory Visit: Payer: Self-pay | Admitting: Family Medicine

## 2018-11-20 DIAGNOSIS — F900 Attention-deficit hyperactivity disorder, predominantly inattentive type: Secondary | ICD-10-CM

## 2018-11-20 MED FILL — ADDERALL XR 30 MG CAP SA: 30 | 30 days supply | Qty: 30 | Fill #0

## 2018-11-20 NOTE — Telephone Encounter (Signed)
I had sent in a 3 mo supply before, did the pharmacy not receive the order? Ty.

## 2018-11-20 NOTE — Telephone Encounter (Signed)
Called Nichole Mcintosh long and Yes they do have refills through October. Called informed the patient

## 2018-11-21 MED FILL — OMEPRAZOLE 40 MG CPDR: 40 | 90 days supply | Qty: 90 | Fill #1

## 2018-12-01 IMAGING — CT CT ABD-PELV W/ CM
2 of 4 series · 15 of 46 positions shown, 17 images · IV contrast (APPLIED)
Comparison: None.

CLINICAL DATA: Left lower quadrant abdominal pain for 3 days.
History of partial colectomy for diverticulitis in 3633.

EXAM:
CT ABDOMEN AND PELVIS WITH CONTRAST
TECHNIQUE: Multidetector CT imaging of the abdomen and pelvis was performed
using the standard protocol following bolus administration of
intravenous contrast.
CONTRAST:  100mL 1QKXE0-8PP IOPAMIDOL (1QKXE0-8PP) INJECTION 61%

[Series 2: axial st · axial · 0.79mm/px · z∈[+250,+680]mm · 12 of 94 slices shown, 14 images]
[im 4/94  soft-tissue]
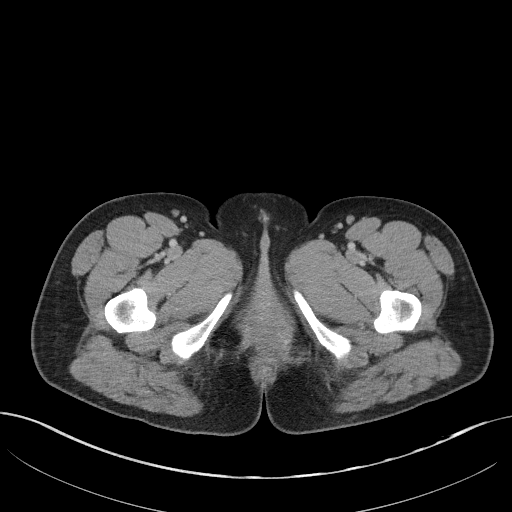
[im 4/94  bone]
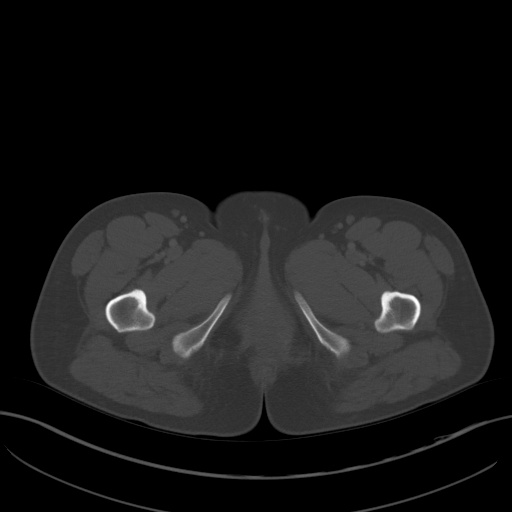
[im 12/94  soft-tissue]
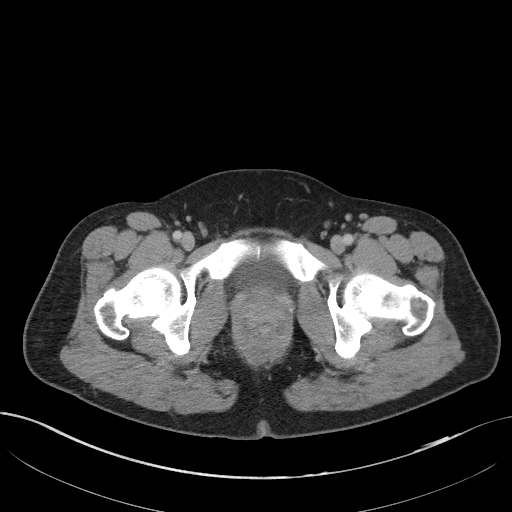
[im 20/94  soft-tissue]
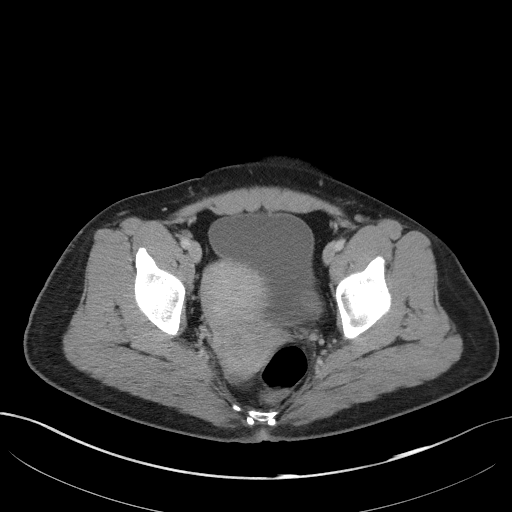
[im 28/94  soft-tissue]
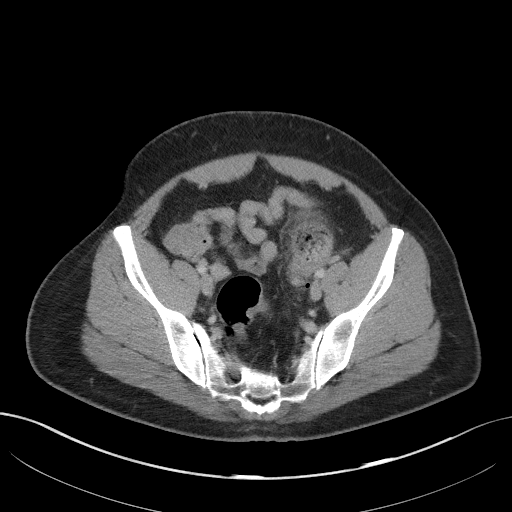
[im 35/94  soft-tissue]
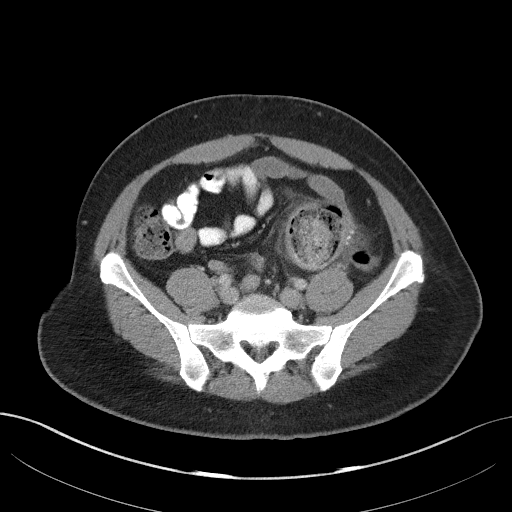
[im 43/94  soft-tissue]
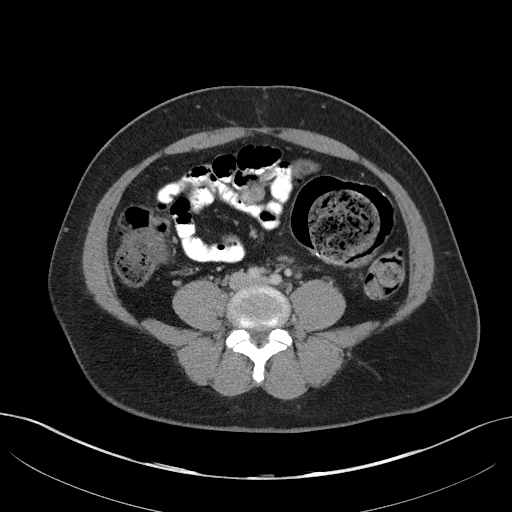
[im 51/94  soft-tissue]
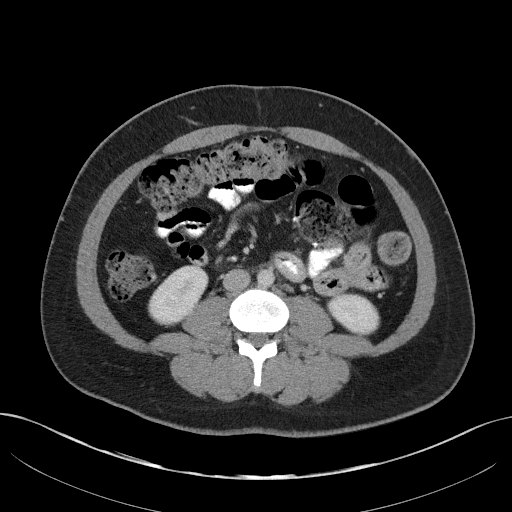
[im 59/94  soft-tissue]
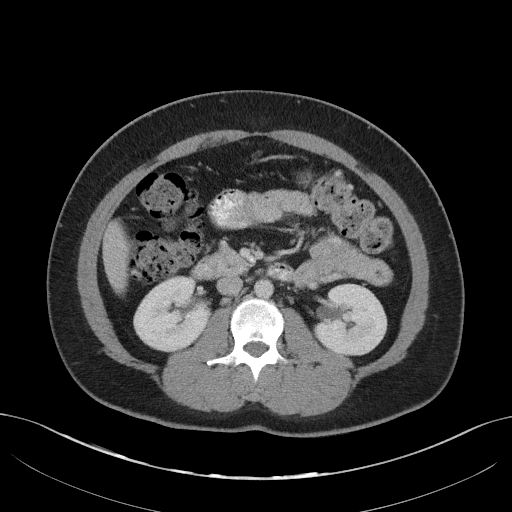
[im 66/94  soft-tissue]
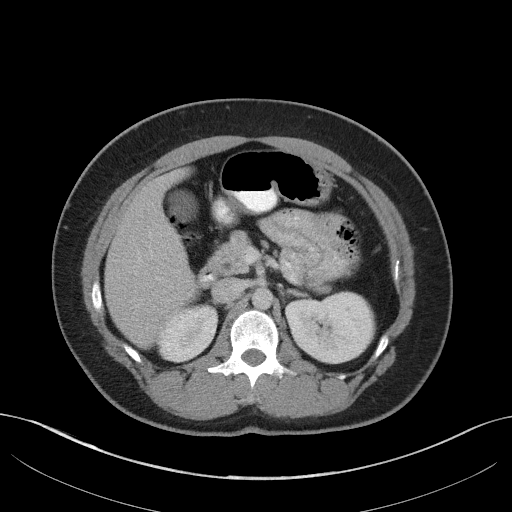
[im 66/94  bone]
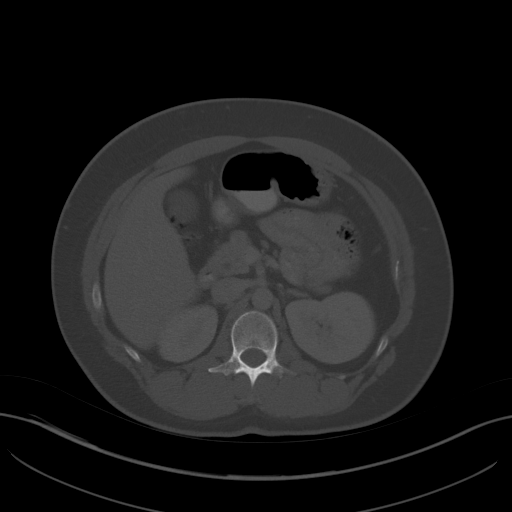
[im 74/94  soft-tissue]
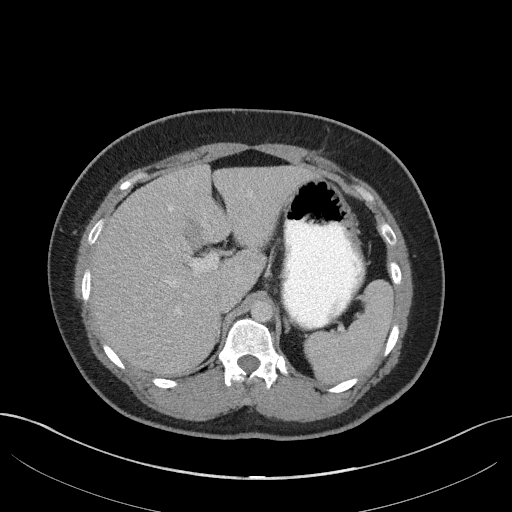
[im 82/94  soft-tissue]
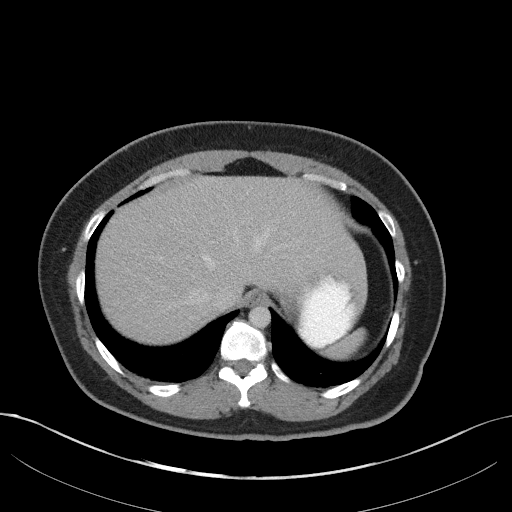
[im 90/94  soft-tissue]
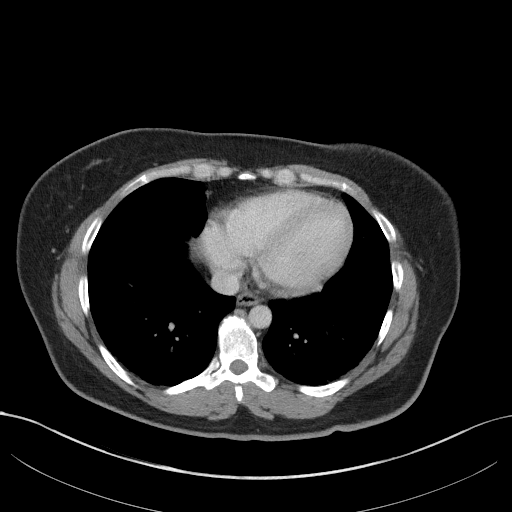

[Series 5: coronal st · coronal · 0.65mm/px · 3 of 99 slices shown]
[im 33/99  soft-tissue]
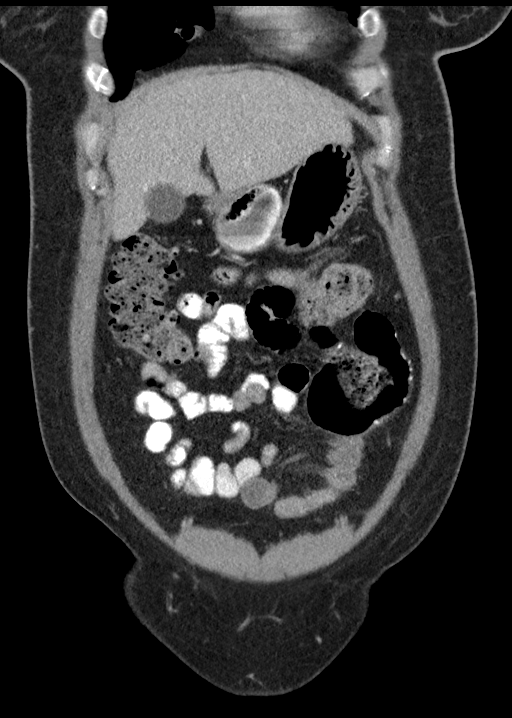
[im 44/99  soft-tissue]
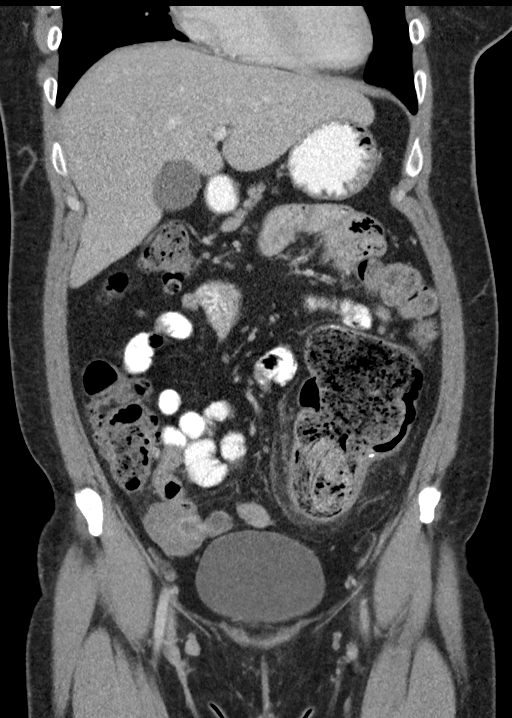
[im 55/99  soft-tissue]
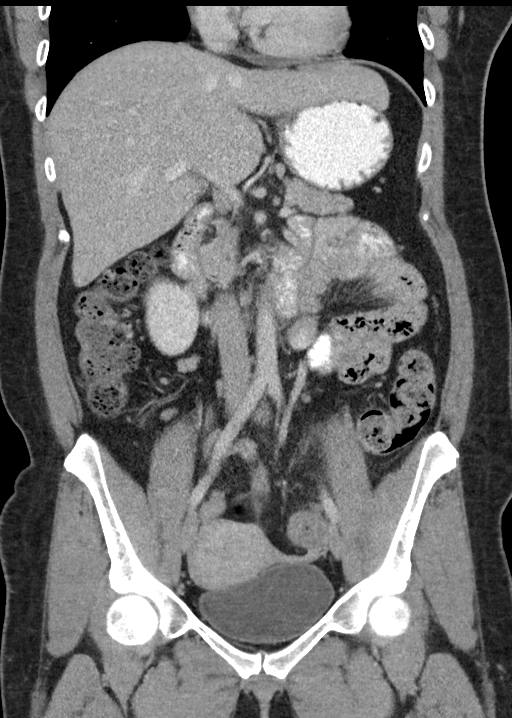

[15 of 46 positions shown; findings below may reference images not displayed]

FINDINGS: Lower chest: Hypoventilatory changes in the dependent lung bases.
Otherwise no acute abnormality.

Hepatobiliary: Diffuse hepatic steatosis. No liver mass. No liver
surface irregularity. Normal gallbladder with no radiopaque
cholelithiasis. No biliary ductal dilatation.

Pancreas: Normal, with no mass or duct dilation.

Spleen: Normal size. No mass.

Adrenals/Urinary Tract: Normal adrenals. Normal kidneys with no
hydronephrosis and no renal mass. Normal bladder.

Stomach/Bowel: Grossly normal stomach. Normal caliber small bowel
with no small bowel wall thickening. Appendix not discretely
visualized. No pericecal inflammatory changes. Status post partial
sigmoid colectomy with distal colonic anastomosis in the left lower
abdomen. There is diffuse mild diverticulosis throughout the remnant
large-bowel. There is a large volume of stool throughout the colon,
most prominent in the region of the distal colonic anastomosis with
prominent distention of the large bowel by stool in this location.
There is focal circumferential colonic wall thickening with
associated pericolonic fat stranding in the distended portion of the
sigmoid colon just distal to the colonic anastomosis (series 2/image
61). The sigmoid colon and rectum are relatively collapsed distal to
the site of colonic wall thickening. No pneumatosis. No pericolonic
free air.

Vascular/Lymphatic: Normal caliber abdominal aorta. Patent portal,
splenic, hepatic and renal veins. No pathologically enlarged lymph
nodes in the abdomen or pelvis.

Reproductive: Grossly normal anteverted uterus. Right ovarian corpus
luteum. No adnexal masses.

Other: No pneumoperitoneum, ascites or focal fluid collection.

Musculoskeletal: No aggressive appearing focal osseous lesions. Mild
lower thoracic spondylosis.
IMPRESSION: 1. Nonspecific focal colitis in the sigmoid colon just distal to the
colonic anastomosis in the left lower abdomen. Although this may
represent acute diverticulitis given underlying diffuse colonic
diverticulosis, this is not certain and other infectious or
inflammatory etiologies should be considered.
2. Large colonic stool volume, most prominent in the region of the
distal colonic anastomosis, with apparent focal caliber transition
just distal to the site of sigmoid colon wall thickening. An
underlying peri-anastomotic colonic stricture is difficult to
exclude. No pneumatosis or pericolonic free air. Surgical
consultation advised.
3. Diffuse hepatic steatosis.

## 2018-12-04 ENCOUNTER — Ambulatory Visit (INDEPENDENT_AMBULATORY_CARE_PROVIDER_SITE_OTHER): Payer: No Typology Code available for payment source | Admitting: Family Medicine

## 2018-12-04 ENCOUNTER — Other Ambulatory Visit: Payer: Self-pay

## 2018-12-04 ENCOUNTER — Encounter: Payer: Self-pay | Admitting: Family Medicine

## 2018-12-04 VITALS — BP 134/80 | HR 98 | Temp 98.7°F | Ht 64.0 in | Wt 147.2 lb

## 2018-12-04 DIAGNOSIS — Z0000 Encounter for general adult medical examination without abnormal findings: Secondary | ICD-10-CM | POA: Diagnosis not present

## 2018-12-04 NOTE — Progress Notes (Signed)
Chief Complaint  Patient presents with  . Annual Exam     Well Woman Nichole Mcintosh is here for a complete physical.   Her last physical was >1 year ago.  Current diet: in general, a "healthy" diet. Current exercise: elliptical machine.  No LMP recorded.  Seatbelt? Yes  Health Maintenance Pap/HPV- Yes Tetanus- Yes HIV screening- Yes  Past Medical History:  Diagnosis Date  . Attention deficit hyperactivity disorder (ADHD) 11/03/2015  . Colitis   . Depression   . Diverticulitis   . GERD (gastroesophageal reflux disease)   . Headache   . Heart murmur    as a child   . Hypertension   . PONV (postoperative nausea and vomiting)      Past Surgical History:  Procedure Laterality Date  . COLON SURGERY  2011   Partial colectomy, diverticulitis  . COLONOSCOPY WITH PROPOFOL N/A 08/01/2016   Procedure: COLONOSCOPY WITH PROPOFOL;  Surgeon: Sherrilyn Rist, MD;  Location: [facility];  Service: Gastroenterology;  Laterality: N/A;  . TUBAL LIGATION  2004    Medications  Current Outpatient Medications on File Prior to Visit  Medication Sig Dispense Refill  . amLODipine (NORVASC) 10 MG tablet TAKE 1 TABLET BY MOUTH ONCE DAILY 90 tablet 1  . amphetamine-dextroamphetamine (ADDERALL XR) 30 MG 24 hr capsule Take 1 capsule (30 mg total) by mouth daily. 30 capsule 0  . cetirizine (ZYRTEC) 10 MG tablet Take 10 mg by mouth daily.    . metoprolol succinate (TOPROL-XL) 50 MG 24 hr tablet TAKE 1 TABLET (50 MG TOTAL) BY MOUTH DAILY. TAKE WITH OR IMMEDIATELY FOLLOWING A MEAL. 90 tablet 1  . omeprazole (PRILOSEC) 40 MG capsule TAKE 1 CAPSULE BY MOUTH DAILY 90 capsule 2   Allergies No Known Allergies  Review of Systems: Constitutional:  no unexpected weight changes Eye:  no recent significant change in vision Ear/Nose/Mouth/Throat:  Ears:  no tinnitus or vertigo and no recent change in hearing Nose/Mouth/Throat:  no complaints of nasal congestion, no sore throat Cardiovascular: no chest  pain Respiratory:  no cough and no shortness of breath Gastrointestinal:  no abdominal pain, no change in bowel habits GU:  Female: negative for dysuria or pelvic pain Musculoskeletal/Extremities:  no pain of the joints Integumentary (Skin/Breast):  no abnormal skin lesions reported Neurologic:  no headaches Endocrine:  denies fatigue Hematologic/Lymphatic:  No areas of easy bleeding  Exam BP 134/80 (BP Location: Left Arm, Patient Position: Sitting, Cuff Size: Normal)   Pulse 98   Temp 98.7 F (37.1 C) (Temporal)   Ht 5\' 4"  (1.626 m)   Wt 147 lb 4 oz (66.8 kg)   SpO2 97%   BMI 25.28 kg/m  General:  well developed, well nourished, in no apparent distress Skin:  no significant moles, warts, or growths Head:  no masses, lesions, or tenderness Eyes:  pupils equal and round, sclera anicteric without injection Ears:  canals without lesions, TMs shiny without retraction, no obvious effusion, no erythema Nose:  nares patent, septum midline, mucosa normal, and no drainage or sinus tenderness Throat/Pharynx:  lips and gingiva without lesion; tongue and uvula midline; non-inflamed pharynx; no exudates or postnasal drainage Neck: neck supple without adenopathy, thyromegaly, or masses Lungs:  clear to auscultation, breath sounds equal bilaterally, no respiratory distress Cardio:  regular rate and rhythm, no bruits, no LE edema Abdomen:  abdomen soft, nontender; bowel sounds normal; no masses or organomegaly Genital: Defer to GYN Musculoskeletal:  symmetrical muscle groups noted without atrophy or deformity Extremities:  no clubbing, cyanosis, or edema, no deformities, no skin discoloration Neuro:  gait normal; deep tendon reflexes normal and symmetric Psych: well oriented with normal range of affect and appropriate judgment/insight  Assessment and Plan  Well adult exam - Plan: CBC, Comprehensive metabolic panel, Lipid panel   Well 33 y.o. female. Counseled on diet and exercise. Flu shot  in next couple weeks, she needs for work Future labs. Other orders as above. Follow up in 6 mo or prn. The patient voiced understanding and agreement to the plan.  [place], DO 12/04/18 1:52 PM

## 2018-12-04 NOTE — Patient Instructions (Addendum)
Give [place] 2-3 business days to get the results of your labs back.   Keep the diet clean and stay active. Consider lifting weights and yoga.  Let us know if you need anything.

## 2018-12-13 ENCOUNTER — Encounter: Payer: Self-pay | Admitting: Family Medicine

## 2018-12-13 DIAGNOSIS — F900 Attention-deficit hyperactivity disorder, predominantly inattentive type: Secondary | ICD-10-CM

## 2018-12-14 ENCOUNTER — Encounter: Payer: Self-pay | Admitting: Family Medicine

## 2018-12-14 MED ORDER — AMPHETAMINE-DEXTROAMPHET ER 30 MG PO CP24: 30.0000 mg | ORAL_CAPSULE | Freq: Every day | ORAL | 0 refills | Status: DC

## 2018-12-14 MED FILL — ADDERALL XR 30 MG CAP SA: 30 | 30 days supply | Qty: 30 | Fill #0

## 2019-01-15 ENCOUNTER — Other Ambulatory Visit: Payer: Self-pay | Admitting: Family Medicine

## 2019-01-15 DIAGNOSIS — I10 Essential (primary) hypertension: Secondary | ICD-10-CM

## 2019-01-15 MED FILL — AMLODIPINE BESYLATE 10 MG T: 10 | 90 days supply | Qty: 90 | Fill #0

## 2019-01-15 MED FILL — ADDERALL XR 30 MG CAP SA: 30 | 30 days supply | Qty: 30 | Fill #0

## 2019-01-15 MED FILL — METOPROLOL SUCCINATE ER 50: 50 | 90 days supply | Qty: 90 | Fill #0

## 2019-02-23 MED FILL — OMEPRAZOLE 40 MG CPDR: 40 | 90 days supply | Qty: 90 | Fill #0

## 2019-02-23 MED FILL — ADDERALL XR 30 MG CAP SA: 30 | 30 days supply | Qty: 30 | Fill #0

## 2019-03-29 MED FILL — ADDERALL XR 30 MG CAP SA: 30 | 30 days supply | Qty: 30 | Fill #0

## 2019-04-07 ENCOUNTER — Encounter: Payer: Self-pay | Admitting: Family Medicine

## 2019-04-08 ENCOUNTER — Telehealth: Payer: Self-pay | Admitting: Family Medicine

## 2019-04-08 NOTE — Telephone Encounter (Signed)
Pt came in office stating is needing rx for Concerta refill, pt stated that provider was going to change of meds from October from Adderall to Concerta, pt is needing to get this meds Concerta sent to [facility] patient. Pt tel [phone]. Please advise.

## 2019-04-09 ENCOUNTER — Telehealth: Payer: Self-pay | Admitting: Family Medicine

## 2019-04-09 NOTE — Telephone Encounter (Signed)
Courtney @ [place] [facility] [phone]), would like to know if we received Physical Paperwork sent yesterday ?

## 2019-04-12 MED ORDER — METHYLPHENIDATE HCL ER (OSM) 27 MG PO TBCR: 27.0000 mg | EXTENDED_RELEASE_TABLET | ORAL | 0 refills | Status: DC

## 2019-04-12 MED FILL — CONCERTA 27 MG TABLET ER: 27 | 30 days supply | Qty: 30 | Fill #0

## 2019-04-12 NOTE — Telephone Encounter (Signed)
Physical form was completed and faxed

## 2019-04-13 NOTE — Telephone Encounter (Signed)
Received complete form from [employer]. Have completed what I can do and put in PCP folder to complete when back in the office on 04/19/2018.  Will then fax.

## 2019-04-14 ENCOUNTER — Telehealth: Payer: Self-pay

## 2019-04-14 NOTE — Telephone Encounter (Signed)
We did received the form yesterday.  Completed what information I could do but PCP is out of the office until 04/20/19 and will complete MD's section at that time and will then fax this over.

## 2019-04-14 NOTE — Telephone Encounter (Signed)
Toni Amend from [facility]  Called in to see if we can fax over the information that was sent on yesterday the form for the pat was not filled out completely.    Please fax to [phone]

## 2019-04-16 NOTE — Telephone Encounter (Signed)
Toni Amend called again. I let her know that Carmelia Roller will be back in the office on  04/20/19

## 2019-04-20 NOTE — Telephone Encounter (Signed)
Form completed/faxed and patient informed

## 2019-04-23 ENCOUNTER — Other Ambulatory Visit: Payer: Self-pay | Admitting: Family Medicine

## 2019-04-23 DIAGNOSIS — I10 Essential (primary) hypertension: Secondary | ICD-10-CM

## 2019-04-23 MED FILL — AMLODIPINE BESYLATE 10 MG T: 10 | 90 days supply | Qty: 90 | Fill #0

## 2019-04-26 ENCOUNTER — Other Ambulatory Visit: Payer: Self-pay | Admitting: Family Medicine

## 2019-04-26 DIAGNOSIS — I10 Essential (primary) hypertension: Secondary | ICD-10-CM

## 2019-04-26 MED ORDER — AMLODIPINE BESYLATE 10 MG PO TABS: 10.0000 mg | ORAL_TABLET | Freq: Every day | ORAL | 3 refills | Status: DC

## 2019-05-20 ENCOUNTER — Other Ambulatory Visit: Payer: Self-pay | Admitting: Family Medicine

## 2019-05-20 NOTE — Telephone Encounter (Signed)
Requesting: Concerta Contract:none UDS: 08/29/2017 needs uds updated Last Visit:12/04/2018 Next Visit:none Last Refill:04/12/2019  Please Advise

## 2019-05-21 MED ORDER — METHYLPHENIDATE HCL ER (OSM) 27 MG PO TBCR: 27.0000 mg | EXTENDED_RELEASE_TABLET | ORAL | 0 refills | Status: DC

## 2019-05-21 MED FILL — CONCERTA 27 MG TABLET ER: 27 | 30 days supply | Qty: 30 | Fill #0

## 2019-05-21 MED FILL — OMEPRAZOLE 40 MG CPDR: 40 | 90 days supply | Qty: 90 | Fill #0

## 2019-06-24 ENCOUNTER — Other Ambulatory Visit: Payer: Self-pay | Admitting: Family Medicine

## 2019-06-24 MED FILL — CONCERTA 27 MG TABLET ER: 27 | 30 days supply | Qty: 30 | Fill #0

## 2019-06-24 NOTE — Telephone Encounter (Signed)
Requesting: concerta Contract: 7.6.19 UDS:n/a Last Visit:10.20.20 Next Visit:n/a Last Refill:3.19.21  Please Advise

## 2019-06-24 NOTE — Telephone Encounter (Signed)
Sched for 6 mo ck plz, can be virtual. Ty.

## 2019-06-24 NOTE — Telephone Encounter (Signed)
Called patient to schedule 6 month follow up & she stated she will call back.

## 2019-07-26 ENCOUNTER — Other Ambulatory Visit: Payer: Self-pay | Admitting: Family Medicine

## 2019-07-26 ENCOUNTER — Other Ambulatory Visit: Payer: Self-pay

## 2019-07-26 DIAGNOSIS — I10 Essential (primary) hypertension: Secondary | ICD-10-CM

## 2019-07-26 MED FILL — AMLODIPINE BESYLATE 10 MG T: 10 | 90 days supply | Qty: 90 | Fill #0

## 2019-07-26 MED FILL — CONCERTA 27 MG TABLET ER: 27 | 30 days supply | Qty: 30 | Fill #0

## 2019-07-26 NOTE — Telephone Encounter (Signed)
Last refill #30---06/24/2019 Last OV---12/04/2018 08/29/2017 for CSC/UDS

## 2019-08-01 MED FILL — OMEPRAZOLE 40 MG CPDR: 40 | 90 days supply | Qty: 90 | Fill #0

## 2019-08-26 ENCOUNTER — Other Ambulatory Visit: Payer: Self-pay | Admitting: Family Medicine

## 2019-08-26 MED ORDER — METHYLPHENIDATE HCL ER (OSM) 27 MG PO TBCR: 27.0000 mg | EXTENDED_RELEASE_TABLET | Freq: Every morning | ORAL | 0 refills | Status: DC

## 2019-08-26 MED FILL — CONCERTA 27 MG TABLET ER: 27 | 30 days supply | Qty: 30 | Fill #0

## 2019-08-31 ENCOUNTER — Other Ambulatory Visit: Payer: Self-pay | Admitting: Family Medicine

## 2019-08-31 ENCOUNTER — Other Ambulatory Visit: Payer: Self-pay

## 2019-08-31 ENCOUNTER — Encounter: Payer: Self-pay | Admitting: Family Medicine

## 2019-08-31 ENCOUNTER — Ambulatory Visit (INDEPENDENT_AMBULATORY_CARE_PROVIDER_SITE_OTHER): Payer: No Typology Code available for payment source | Admitting: Family Medicine

## 2019-08-31 VITALS — BP 108/72 | HR 98 | Temp 96.8°F | Ht 64.0 in | Wt 154.5 lb

## 2019-08-31 DIAGNOSIS — Z0000 Encounter for general adult medical examination without abnormal findings: Secondary | ICD-10-CM | POA: Diagnosis not present

## 2019-08-31 DIAGNOSIS — F900 Attention-deficit hyperactivity disorder, predominantly inattentive type: Secondary | ICD-10-CM | POA: Diagnosis not present

## 2019-08-31 DIAGNOSIS — I10 Essential (primary) hypertension: Secondary | ICD-10-CM

## 2019-08-31 LAB — CBC
HCT: 41.7 % (ref 36.0–46.0)
Hemoglobin: 14 g/dL (ref 12.0–15.0)
MCHC: 33.6 g/dL (ref 30.0–36.0)
MCV: 94.4 fl (ref 78.0–100.0)
Platelets: 342 10*3/uL (ref 150.0–400.0)
RBC: 4.42 Mil/uL (ref 3.87–5.11)
RDW: 13.8 % (ref 11.5–15.5)
WBC: 8.4 10*3/uL (ref 4.0–10.5)

## 2019-08-31 LAB — COMPREHENSIVE METABOLIC PANEL
ALT: 13 U/L (ref 0–35)
AST: 15 U/L (ref 0–37)
Albumin: 4.6 g/dL (ref 3.5–5.2)
Alkaline Phosphatase: 84 U/L (ref 39–117)
BUN: 12 mg/dL (ref 6–23)
CO2: 29 mEq/L (ref 19–32)
Calcium: 9.7 mg/dL (ref 8.4–10.5)
Chloride: 104 mEq/L (ref 96–112)
Creatinine, Ser: 0.64 mg/dL (ref 0.40–1.20)
GFR: 104.75 mL/min (ref >60.00)
Glucose, Bld: 90 mg/dL (ref 70–99)
Potassium: 4.1 mEq/L (ref 3.5–5.1)
Sodium: 139 mEq/L (ref 135–145)
Total Bilirubin: 0.6 mg/dL (ref 0.2–1.2)
Total Protein: 6.8 g/dL (ref 6.0–8.3)

## 2019-08-31 LAB — LIPID PANEL
Cholesterol: 233 mg/dL — ABNORMAL HIGH (ref 0–200)
HDL: 45.8 mg/dL (ref >39.00)
LDL Cholesterol: 167 mg/dL — ABNORMAL HIGH (ref 0–99)
NonHDL: 187.61
Total CHOL/HDL Ratio: 5
Triglycerides: 101 mg/dL (ref 0.0–149.0)
VLDL: 20.2 mg/dL (ref 0.0–40.0)

## 2019-08-31 MED ORDER — AMPHETAMINE-DEXTROAMPHET ER 30 MG PO CP24: 30.0000 mg | ORAL_CAPSULE | Freq: Every day | ORAL | 0 refills | Status: DC

## 2019-08-31 MED ORDER — ATORVASTATIN CALCIUM 40 MG PO TABS
40.0000 mg | ORAL_TABLET | Freq: Every day | ORAL | 3 refills | Status: DC
Start: 2019-08-31 — End: 2020-04-03

## 2019-08-31 MED FILL — ADDERALL XR 30 MG CAP SA: 30 | 30 days supply | Qty: 30 | Fill #0

## 2019-08-31 MED FILL — ATORVASTATIN CALCIUM 40 MG: 40 | 90 days supply | Qty: 90 | Fill #0

## 2019-08-31 NOTE — Progress Notes (Signed)
Chief Complaint  Patient presents with  . Follow-up    6 month followup    Nichole Mcintosh is 34 y.o. female here for ADHD follow up.  Patient is currently on Concerta 27 mg/d and compliance is excellent. Symptoms include inattention. Side effects include-None. Patient believes their dose should be changed. Denies tics, weight loss, difficulties with sleep, self-medication, alcohol/drug abuse, chest pain, or palpitations.  Hypertension Patient presents for hypertension follow up. She does monitor home blood pressures. Blood pressures ranging on average from 110-120's/70's. She is compliant with medications- Norvasc 10 mg/d, Toprol XL 50 mg/d. Patient has these side effects of medication: none Diet/exercise are sound.    Past Medical History:  Diagnosis Date  . Attention deficit hyperactivity disorder (ADHD) 11/03/2015  . Colitis   . Depression   . Diverticulitis   . GERD (gastroesophageal reflux disease)   . Headache   . Heart murmur    as a child   . Hypertension   . PONV (postoperative nausea and vomiting)     BP 108/72 (BP Location: Left Arm, Patient Position: Sitting, Cuff Size: Normal)   Pulse 98   Temp (!) 96.8 F (36 C) (Temporal)   Ht 5\' 4"  (1.626 m)   Wt 154 lb 8 oz (70.1 kg)   SpO2 97%   BMI 26.52 kg/m  Gen- awake, alert, appearing stated age Heart- RRR Lungs- CTAB, no accessory muscle use Neuro- no facial tics Psych- age appropriate judgment and insight, normal mood and affect  Attention deficit hyperactivity disorder (ADHD), predominantly inattentive type - Plan: amphetamine-dextroamphetamine (ADDERALL XR) 30 MG 24 hr capsule  Well adult exam - Plan: Lipid panel, Comprehensive metabolic panel, CBC  Essential hypertension  Will go back to Adderall as she did well taking a left over cap.  Cont meds for HTN for now, monitor BP's intermittently.  F/u in 6 mo for CPE. Pt voiced understanding and agreement to the plan.  [place],  DO 08/31/19 9:10 AM

## 2019-09-27 ENCOUNTER — Other Ambulatory Visit: Payer: Self-pay | Admitting: Family Medicine

## 2019-09-27 ENCOUNTER — Other Ambulatory Visit: Payer: Self-pay

## 2019-09-27 DIAGNOSIS — F900 Attention-deficit hyperactivity disorder, predominantly inattentive type: Secondary | ICD-10-CM

## 2019-09-27 MED ORDER — AMPHETAMINE-DEXTROAMPHET ER 30 MG PO CP24: ORAL_CAPSULE | ORAL | 0 refills | Status: DC

## 2019-09-27 MED ORDER — AMPHETAMINE-DEXTROAMPHET ER 30 MG PO CP24: 30.0000 mg | ORAL_CAPSULE | Freq: Every day | ORAL | 0 refills | Status: DC

## 2019-09-27 MED FILL — ADDERALL XR 30 MG CAP SA: 30 | 30 days supply | Qty: 30 | Fill #0

## 2019-11-04 MED FILL — ADDERALL XR 30 MG CAP SA: 30 | 30 days supply | Qty: 30 | Fill #0

## 2019-11-09 ENCOUNTER — Other Ambulatory Visit: Payer: Self-pay

## 2019-11-09 ENCOUNTER — Other Ambulatory Visit: Payer: Self-pay | Admitting: Family Medicine

## 2019-11-09 DIAGNOSIS — M79601 Pain in right arm: Secondary | ICD-10-CM

## 2019-11-09 DIAGNOSIS — I10 Essential (primary) hypertension: Secondary | ICD-10-CM

## 2019-11-09 DIAGNOSIS — M79605 Pain in left leg: Secondary | ICD-10-CM

## 2019-11-09 MED ORDER — OMEPRAZOLE 40 MG PO CPDR: 40.0000 mg | DELAYED_RELEASE_CAPSULE | Freq: Every day | ORAL | 1 refills | Status: DC

## 2019-11-09 MED ORDER — METOPROLOL SUCCINATE ER 50 MG PO TB24: 50.0000 mg | ORAL_TABLET | Freq: Every day | ORAL | 1 refills | Status: DC

## 2019-11-09 MED FILL — OMEPRAZOLE 40 MG CPDR: 40 | 90 days supply | Qty: 90 | Fill #0

## 2019-11-09 MED FILL — METOPROLOL SUCCINATE ER 50: 50 | 90 days supply | Qty: 90 | Fill #0

## 2019-12-01 MED FILL — ADDERALL XR 30 MG CAP SA: 30 | 30 days supply | Qty: 30 | Fill #0

## 2019-12-21 MED FILL — AMLODIPINE BESYLATE 10 MG T: 10 | 90 days supply | Qty: 90 | Fill #1

## 2020-01-03 ENCOUNTER — Other Ambulatory Visit: Payer: Self-pay | Admitting: Family Medicine

## 2020-01-03 ENCOUNTER — Other Ambulatory Visit: Payer: Self-pay

## 2020-01-03 DIAGNOSIS — F900 Attention-deficit hyperactivity disorder, predominantly inattentive type: Secondary | ICD-10-CM

## 2020-01-03 MED ORDER — AMPHETAMINE-DEXTROAMPHET ER 30 MG PO CP24: 30.0000 mg | ORAL_CAPSULE | Freq: Every day | ORAL | 0 refills | Status: DC

## 2020-01-03 MED ORDER — AMPHETAMINE-DEXTROAMPHET ER 30 MG PO CP24: ORAL_CAPSULE | ORAL | 0 refills | Status: DC

## 2020-01-03 MED FILL — ADDERALL XR 30 MG CAP SA: 30 | 30 days supply | Qty: 30 | Fill #0

## 2020-01-03 NOTE — Telephone Encounter (Signed)
Requesting: Adderall XR 30mg  Contract: 11/03/2015 UDS: 08/29/2017 Low risk Last Visit: 08/31/2019 Next Visit: None scheduled Last Refill: 11/26/2019 #30 and 0RF Pt sig: 1 capsule daily  Please Advise

## 2020-02-02 MED FILL — ADDERALL XR 30 MG CAP SA: 30 | 30 days supply | Qty: 30 | Fill #0

## 2020-02-03 MED FILL — OMEPRAZOLE 40 MG CPDR: 40 | 90 days supply | Qty: 90 | Fill #1

## 2020-02-03 MED FILL — METOPROLOL SUCCINATE ER 50: 50 | 90 days supply | Qty: 90 | Fill #1

## 2020-03-06 ENCOUNTER — Other Ambulatory Visit: Payer: Self-pay

## 2020-03-06 ENCOUNTER — Other Ambulatory Visit: Payer: Self-pay | Admitting: Family Medicine

## 2020-03-06 DIAGNOSIS — F900 Attention-deficit hyperactivity disorder, predominantly inattentive type: Secondary | ICD-10-CM

## 2020-03-06 MED ORDER — AMPHETAMINE-DEXTROAMPHET ER 30 MG PO CP24: 30.0000 mg | ORAL_CAPSULE | Freq: Every day | ORAL | 0 refills | Status: DC

## 2020-03-06 MED FILL — ADDERALL XR 30 MG CAP SA: 30 | 30 days supply | Qty: 30 | Fill #0

## 2020-03-06 NOTE — Telephone Encounter (Signed)
Pt due for visit. Can schedule CPE at her convenience. Ty.

## 2020-03-06 NOTE — Telephone Encounter (Signed)
Requesting: Adderall XR 30mg  Contract: None UDS: None Last Visit: 08/31/2019 Next Visit: None Last Refill 01/03/2020 #30 and 0RF (X3)  Please Advise

## 2020-03-06 NOTE — Telephone Encounter (Signed)
Called pt and lvm to return call.  

## 2020-03-07 NOTE — Telephone Encounter (Signed)
Called the patient informed to schedule CPE asap at her convenience. She verbalized understanding.

## 2020-03-23 MED FILL — AMLODIPINE BESYLATE 10 MG T: 10 | 90 days supply | Qty: 90 | Fill #2

## 2020-03-24 ENCOUNTER — Ambulatory Visit: Payer: No Typology Code available for payment source | Admitting: Family Medicine

## 2020-03-31 ENCOUNTER — Other Ambulatory Visit: Payer: Self-pay

## 2020-04-03 ENCOUNTER — Ambulatory Visit (INDEPENDENT_AMBULATORY_CARE_PROVIDER_SITE_OTHER): Payer: No Typology Code available for payment source | Admitting: Family Medicine

## 2020-04-03 ENCOUNTER — Encounter: Payer: Self-pay | Admitting: Family Medicine

## 2020-04-03 ENCOUNTER — Other Ambulatory Visit: Payer: Self-pay

## 2020-04-03 ENCOUNTER — Other Ambulatory Visit: Payer: Self-pay | Admitting: Family Medicine

## 2020-04-03 VITALS — BP 124/78 | HR 105 | Temp 97.7°F | Resp 18 | Ht 63.0 in

## 2020-04-03 DIAGNOSIS — F900 Attention-deficit hyperactivity disorder, predominantly inattentive type: Secondary | ICD-10-CM | POA: Diagnosis not present

## 2020-04-03 DIAGNOSIS — Z1159 Encounter for screening for other viral diseases: Secondary | ICD-10-CM

## 2020-04-03 DIAGNOSIS — Z0000 Encounter for general adult medical examination without abnormal findings: Secondary | ICD-10-CM | POA: Diagnosis not present

## 2020-04-03 DIAGNOSIS — E785 Hyperlipidemia, unspecified: Secondary | ICD-10-CM

## 2020-04-03 LAB — LIPID PANEL
Cholesterol: 241 mg/dL — ABNORMAL HIGH (ref 0–200)
HDL: 55.3 mg/dL (ref >39.00)
LDL Cholesterol: 160 mg/dL — ABNORMAL HIGH (ref 0–99)
NonHDL: 185.43
Total CHOL/HDL Ratio: 4
Triglycerides: 129 mg/dL (ref 0.0–149.0)
VLDL: 25.8 mg/dL (ref 0.0–40.0)

## 2020-04-03 LAB — COMPREHENSIVE METABOLIC PANEL
ALT: 15 U/L (ref 0–35)
AST: 16 U/L (ref 0–37)
Albumin: 4.4 g/dL (ref 3.5–5.2)
Alkaline Phosphatase: 84 U/L (ref 39–117)
BUN: 8 mg/dL (ref 6–23)
CO2: 27 mEq/L (ref 19–32)
Calcium: 9.6 mg/dL (ref 8.4–10.5)
Chloride: 104 mEq/L (ref 96–112)
Creatinine, Ser: 0.67 mg/dL (ref 0.40–1.20)
GFR: 111.98 mL/min (ref >60.00)
Glucose, Bld: 105 mg/dL — ABNORMAL HIGH (ref 70–99)
Potassium: 3.6 mEq/L (ref 3.5–5.1)
Sodium: 139 mEq/L (ref 135–145)
Total Bilirubin: 0.4 mg/dL (ref 0.2–1.2)
Total Protein: 6.8 g/dL (ref 6.0–8.3)

## 2020-04-03 MED ORDER — ROSUVASTATIN CALCIUM 5 MG PO TABS: 5.0000 mg | ORAL_TABLET | Freq: Every day | ORAL | 3 refills | Status: DC

## 2020-04-03 MED FILL — ROSUVASTATIN CALCIUM 5 MG T: 5 | 30 days supply | Qty: 30 | Fill #0

## 2020-04-03 MED FILL — ADDERALL XR 30 MG CAP SA: 30 | 30 days supply | Qty: 30 | Fill #0

## 2020-04-03 NOTE — Progress Notes (Signed)
Chief Complaint  Patient presents with  . Annual Exam    No concerns/ questions     Well Woman Nichole Mcintosh is here for a complete physical.   Her last physical was >1 year ago.  Current diet: in general, diet could better. Current exercise: none.  Fatigue out of ordinary? No Seatbelt? Yes   Health Maintenance Pap/HPV- Yes Tetanus- Yes HIV screening- Yes Hep C screening- No  Past Medical History:  Diagnosis Date  . Attention deficit hyperactivity disorder (ADHD) 11/03/2015  . Colitis   . Depression   . Diverticulitis   . GERD (gastroesophageal reflux disease)   . Headache   . Heart murmur    as a child   . Hypertension   . PONV (postoperative nausea and vomiting)      Past Surgical History:  Procedure Laterality Date  . COLON SURGERY  2011   Partial colectomy, diverticulitis  . COLONOSCOPY WITH PROPOFOL N/A 08/01/2016   Procedure: COLONOSCOPY WITH PROPOFOL;  Surgeon: Sherrilyn Rist, MD;  Location: [facility];  Service: Gastroenterology;  Laterality: N/A;  . TUBAL LIGATION  2004    Medications  Current Outpatient Medications on File Prior to Visit  Medication Sig Dispense Refill  . amLODipine (NORVASC) 10 MG tablet TAKE 1 TABLET BY MOUTH ONCE DAILY 90 tablet 0  . amphetamine-dextroamphetamine (ADDERALL XR) 30 MG 24 hr capsule TAKE 1 CAPSULE (30 MG TOTAL) BY MOUTH DAILY 30 capsule 0  . amphetamine-dextroamphetamine (ADDERALL XR) 30 MG 24 hr capsule TAKE 1 CAPSULE (30 MG TOTAL) BY MOUTH DAILY. 30 capsule 0  . amphetamine-dextroamphetamine (ADDERALL XR) 30 MG 24 hr capsule Take 1 capsule (30 mg total) by mouth daily. 30 capsule 0  . atorvastatin (LIPITOR) 40 MG tablet Take 1 tablet (40 mg total) by mouth daily. 90 tablet 3  . cetirizine (ZYRTEC) 10 MG tablet Take 10 mg by mouth daily.    . metoprolol succinate (TOPROL-XL) 50 MG 24 hr tablet Take 1 tablet (50 mg total) by mouth daily. Take with or immediately following a meal. 90 tablet 1  . omeprazole (PRILOSEC)  40 MG capsule Take 1 capsule (40 mg total) by mouth daily. 90 capsule 1   Allergies No Known Allergies  Review of Systems: Constitutional:  no unexpected weight changes Eye:  no recent significant change in vision Ear/Nose/Mouth/Throat:  Ears:  no tinnitus or vertigo and no recent change in hearing Nose/Mouth/Throat:  no complaints of nasal congestion, no sore throat Cardiovascular: no chest pain Respiratory:  no cough and no shortness of breath Gastrointestinal:  no abdominal pain, no change in bowel habits GU:  Female: negative for dysuria or pelvic pain Musculoskeletal/Extremities:  no pain of the joints Integumentary (Skin/Breast):  no abnormal skin lesions reported Neurologic:  no headaches Endocrine:  denies fatigue Hematologic/Lymphatic:  No areas of easy bleeding  Exam BP 124/78 (BP Location: Left Arm, Patient Position: Sitting, Cuff Size: Normal)   Pulse (!) 105   Temp 97.7 F (36.5 C) (Oral)   Resp 18   Ht 5\' 3"  (1.6 m)   SpO2 98%   BMI 27.37 kg/m  General:  well developed, well nourished, in no apparent distress Skin:  no significant moles, warts, or growths Head:  no masses, lesions, or tenderness Eyes:  pupils equal and round, sclera anicteric without injection Ears:  canals without lesions, TMs shiny without retraction, no obvious effusion, no erythema Nose:  nares patent, septum midline, mucosa normal, and no drainage or sinus tenderness Throat/Pharynx:  lips  and gingiva without lesion; tongue and uvula midline; non-inflamed pharynx; no exudates or postnasal drainage Neck: neck supple without adenopathy, thyromegaly, or masses Lungs:  clear to auscultation, breath sounds equal bilaterally, no respiratory distress Cardio:  regular rate and rhythm, no bruits, no LE edema Abdomen:  abdomen soft, nontender; bowel sounds normal; no masses or organomegaly Genital: Defer to GYN Musculoskeletal:  symmetrical muscle groups noted without atrophy or  deformity Extremities:  no clubbing, cyanosis, or edema, no deformities, no skin discoloration Neuro:  gait normal; deep tendon reflexes normal and symmetric Psych: well oriented with normal range of affect and appropriate judgment/insight  Assessment and Plan  Well adult exam - Plan: Lipid panel, Comprehensive metabolic panel  Attention deficit hyperactivity disorder (ADHD), predominantly inattentive type - Plan: DRUG MONITORING, PANEL 8 WITH CONFIRMATION, URINE  Hyperlipidemia, unspecified hyperlipidemia type - Plan: Hepatitis C antibody  Encounter for hepatitis C screening test for low risk patient   Well 35 y.o. female. Counseled on diet and exercise. Other orders as above. UDS and CSC updated today. Follow up in 6 mo. The patient voiced understanding and agreement to the plan.  Jilda Roche Meyers, DO 04/03/20 10:52 AM

## 2020-04-03 NOTE — Patient Instructions (Signed)
Give us 2-3 business days to get the results of your labs back.   Keep the diet clean and stay active.  Let us know if you need anything. 

## 2020-04-04 ENCOUNTER — Other Ambulatory Visit: Payer: Self-pay | Admitting: Family Medicine

## 2020-04-04 ENCOUNTER — Telehealth: Payer: Self-pay | Admitting: Family Medicine

## 2020-04-04 DIAGNOSIS — E785 Hyperlipidemia, unspecified: Secondary | ICD-10-CM

## 2020-04-04 LAB — HEPATITIS C ANTIBODY
Hepatitis C Ab: NONREACTIVE
SIGNAL TO CUT-OFF: 0.01 (ref <1.00)

## 2020-04-04 NOTE — Telephone Encounter (Signed)
See result notes. 

## 2020-04-04 NOTE — Telephone Encounter (Signed)
Patient is returning your call. (lab results)

## 2020-04-06 LAB — DRUG MONITORING, PANEL 8 WITH CONFIRMATION, URINE
6 Acetylmorphine: NEGATIVE ng/mL (ref <10)
Alcohol Metabolites: NEGATIVE ng/mL
Amphetamine: 4329 ng/mL — ABNORMAL HIGH (ref <250)
Amphetamines: POSITIVE ng/mL — AB (ref <500)
Benzodiazepines: NEGATIVE ng/mL (ref <100)
Buprenorphine, Urine: NEGATIVE ng/mL (ref <5)
Cocaine Metabolite: NEGATIVE ng/mL (ref <150)
Creatinine: 93.6 mg/dL
MDMA: NEGATIVE ng/mL (ref <500)
Marijuana Metabolite: NEGATIVE ng/mL (ref <20)
Methamphetamine: NEGATIVE ng/mL (ref <250)
Opiates: NEGATIVE ng/mL (ref <100)
Oxidant: NEGATIVE ug/mL
Oxycodone: NEGATIVE ng/mL (ref <100)
pH: 6.9 (ref 4.5–9.0)

## 2020-04-06 LAB — DM TEMPLATE

## 2020-05-05 ENCOUNTER — Other Ambulatory Visit: Payer: Self-pay | Admitting: Family Medicine

## 2020-05-05 ENCOUNTER — Other Ambulatory Visit: Payer: Self-pay

## 2020-05-05 DIAGNOSIS — F900 Attention-deficit hyperactivity disorder, predominantly inattentive type: Secondary | ICD-10-CM

## 2020-05-05 MED ORDER — AMPHETAMINE-DEXTROAMPHET ER 30 MG PO CP24: 30.0000 mg | ORAL_CAPSULE | Freq: Every day | ORAL | 0 refills | Status: DC

## 2020-05-05 MED ORDER — AMPHETAMINE-DEXTROAMPHET ER 30 MG PO CP24: ORAL_CAPSULE | ORAL | 0 refills | Status: DC

## 2020-05-05 MED FILL — AMPHETAMINE-DEXTROAMPHET ER: 30 | 30 days supply | Qty: 30 | Fill #0

## 2020-05-05 MED FILL — OMEPRAZOLE 40 MG CPDR: 40 | 90 days supply | Qty: 90 | Fill #0

## 2020-05-05 NOTE — Telephone Encounter (Signed)
Requesting: Adderall XR 30mg  Contract: none  UDS: none Last Visit: 04/03/2020 Next Visit: None Last Refill: 03/06/2020 #30 and 0RF  Please Advise

## 2020-05-22 ENCOUNTER — Other Ambulatory Visit: Payer: Self-pay | Admitting: Family Medicine

## 2020-05-22 DIAGNOSIS — I10 Essential (primary) hypertension: Secondary | ICD-10-CM

## 2020-05-22 MED FILL — METOPROLOL SUCCINATE ER 50: 50 | 90 days supply | Qty: 90 | Fill #0

## 2020-05-26 ENCOUNTER — Other Ambulatory Visit (HOSPITAL_BASED_OUTPATIENT_CLINIC_OR_DEPARTMENT_OTHER): Payer: Self-pay

## 2020-06-06 ENCOUNTER — Other Ambulatory Visit (HOSPITAL_COMMUNITY): Payer: Self-pay

## 2020-06-06 MED FILL — Amphetamine-Dextroamphetamine Cap ER 24HR 30 MG: ORAL | 30 days supply | Qty: 30 | Fill #0 | Status: AC

## 2020-06-08 ENCOUNTER — Other Ambulatory Visit (HOSPITAL_COMMUNITY): Payer: Self-pay

## 2020-07-03 ENCOUNTER — Other Ambulatory Visit: Payer: Self-pay

## 2020-07-03 ENCOUNTER — Other Ambulatory Visit (HOSPITAL_COMMUNITY): Payer: Self-pay

## 2020-07-03 DIAGNOSIS — I10 Essential (primary) hypertension: Secondary | ICD-10-CM

## 2020-07-03 MED ORDER — AMLODIPINE BESYLATE 10 MG PO TABS
1.0000 | ORAL_TABLET | Freq: Every day | ORAL | 0 refills | Status: DC
  Filled 2020-07-03: qty 30, 30d supply, fill #0
  Filled 2020-08-07: qty 30, 30d supply, fill #1
  Filled 2020-09-05: qty 30, 30d supply, fill #2

## 2020-07-03 MED FILL — Amphetamine-Dextroamphetamine Cap ER 24HR 30 MG: ORAL | 30 days supply | Qty: 30 | Fill #0 | Status: AC

## 2020-07-03 MED FILL — Omeprazole Cap Delayed Release 40 MG: ORAL | 30 days supply | Qty: 30 | Fill #0 | Status: AC

## 2020-08-07 ENCOUNTER — Other Ambulatory Visit (HOSPITAL_COMMUNITY): Payer: Self-pay

## 2020-08-07 ENCOUNTER — Other Ambulatory Visit: Payer: Self-pay | Admitting: Family Medicine

## 2020-08-07 DIAGNOSIS — F900 Attention-deficit hyperactivity disorder, predominantly inattentive type: Secondary | ICD-10-CM

## 2020-08-07 MED ORDER — AMPHETAMINE-DEXTROAMPHET ER 30 MG PO CP24: 30.0000 mg | ORAL_CAPSULE | Freq: Every day | ORAL | 0 refills | Status: DC

## 2020-08-07 MED ORDER — AMPHETAMINE-DEXTROAMPHET ER 30 MG PO CP24
30.0000 mg | ORAL_CAPSULE | Freq: Every day | ORAL | 0 refills | Status: DC
  Filled 2020-10-04: qty 30, 30d supply, fill #0

## 2020-08-07 MED ORDER — AMPHETAMINE-DEXTROAMPHET ER 30 MG PO CP24
ORAL_CAPSULE | ORAL | 0 refills | Status: DC
  Filled 2020-08-07: qty 30, 30d supply, fill #0

## 2020-08-07 MED FILL — Omeprazole Cap Delayed Release 40 MG: ORAL | 30 days supply | Qty: 30 | Fill #1 | Status: AC

## 2020-08-07 NOTE — Telephone Encounter (Signed)
Requesting: adderall Contract:04/11/2020 UDS:04/03/2020 Last Visit:04/03/2020 Next Visit:n/a Last Refill:07/04/2020  Please Advise

## 2020-08-29 ENCOUNTER — Other Ambulatory Visit (HOSPITAL_COMMUNITY): Payer: Self-pay

## 2020-08-30 ENCOUNTER — Telehealth: Payer: Self-pay

## 2020-08-30 ENCOUNTER — Other Ambulatory Visit (HOSPITAL_COMMUNITY): Payer: Self-pay

## 2020-08-30 DIAGNOSIS — F900 Attention-deficit hyperactivity disorder, predominantly inattentive type: Secondary | ICD-10-CM

## 2020-08-30 MED ORDER — AMPHETAMINE-DEXTROAMPHET ER 30 MG PO CP24
30.0000 mg | ORAL_CAPSULE | Freq: Every day | ORAL | 0 refills | Status: DC
  Filled 2020-08-30 – 2020-09-05 (×2): qty 30, 30d supply, fill #0

## 2020-08-30 NOTE — Telephone Encounter (Signed)
Maxine Glenn, Pharmacist with [facility], called stating patient is asking for early refill on Adderall extended release 30mg .  Medication is due for refill on 7/1, but pt is going to [facility] and is asking for refill to be done today and mailed to her home so she can take it to with her on her trip.

## 2020-09-05 ENCOUNTER — Other Ambulatory Visit (HOSPITAL_COMMUNITY): Payer: Self-pay

## 2020-09-05 MED FILL — Omeprazole Cap Delayed Release 40 MG: ORAL | 30 days supply | Qty: 30 | Fill #2 | Status: AC

## 2020-09-05 MED FILL — Metoprolol Succinate Tab ER 24HR 50 MG (Tartrate Equiv): ORAL | 90 days supply | Qty: 90 | Fill #0 | Status: AC

## 2020-09-05 MED FILL — Rosuvastatin Calcium Tab 5 MG: ORAL | 30 days supply | Qty: 30 | Fill #0 | Status: AC

## 2020-09-06 ENCOUNTER — Other Ambulatory Visit (HOSPITAL_COMMUNITY): Payer: Self-pay

## 2020-10-04 ENCOUNTER — Other Ambulatory Visit: Payer: Self-pay | Admitting: Family Medicine

## 2020-10-04 ENCOUNTER — Other Ambulatory Visit (HOSPITAL_COMMUNITY): Payer: Self-pay

## 2020-10-04 DIAGNOSIS — I10 Essential (primary) hypertension: Secondary | ICD-10-CM

## 2020-10-04 MED ORDER — OMEPRAZOLE 40 MG PO CPDR
DELAYED_RELEASE_CAPSULE | Freq: Every day | ORAL | 1 refills | Status: DC
  Filled 2020-10-04: qty 90, 90d supply, fill #0
  Filled 2021-01-28: qty 90, 90d supply, fill #1

## 2020-10-04 MED ORDER — AMLODIPINE BESYLATE 10 MG PO TABS
10.0000 mg | ORAL_TABLET | Freq: Every day | ORAL | 0 refills | Status: DC
  Filled 2020-10-04: qty 90, 90d supply, fill #0

## 2020-11-03 ENCOUNTER — Other Ambulatory Visit: Payer: Self-pay | Admitting: Family Medicine

## 2020-11-03 ENCOUNTER — Other Ambulatory Visit (HOSPITAL_COMMUNITY): Payer: Self-pay

## 2020-11-03 DIAGNOSIS — F900 Attention-deficit hyperactivity disorder, predominantly inattentive type: Secondary | ICD-10-CM

## 2020-11-03 NOTE — Telephone Encounter (Signed)
Last OV--04/03/20 Last RF--10/06/20 UDS/CSC--08/29/2017

## 2020-11-03 NOTE — Telephone Encounter (Signed)
Last RF--10/06/20 Last OV--04/03/20 CSC/UDS--08/29/2017

## 2020-11-07 ENCOUNTER — Telehealth: Payer: Self-pay | Admitting: Family Medicine

## 2020-11-07 ENCOUNTER — Other Ambulatory Visit: Payer: Self-pay | Admitting: Family Medicine

## 2020-11-07 ENCOUNTER — Other Ambulatory Visit (HOSPITAL_COMMUNITY): Payer: Self-pay

## 2020-11-07 MED ORDER — AMPHETAMINE-DEXTROAMPHET ER 30 MG PO CP24
30.0000 mg | ORAL_CAPSULE | Freq: Every day | ORAL | 0 refills | Status: DC
  Filled 2020-11-07 (×2): qty 30, 30d supply, fill #0

## 2020-11-07 NOTE — Telephone Encounter (Signed)
Pt was told she needed an appointment in order to have Adderall medication refilled. 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 Please advise

## 2020-11-29 ENCOUNTER — Other Ambulatory Visit (HOSPITAL_COMMUNITY): Payer: Self-pay

## 2020-11-29 ENCOUNTER — Other Ambulatory Visit: Payer: Self-pay | Admitting: Family Medicine

## 2020-11-29 DIAGNOSIS — F900 Attention-deficit hyperactivity disorder, predominantly inattentive type: Secondary | ICD-10-CM

## 2020-11-29 MED ORDER — AMPHETAMINE-DEXTROAMPHET ER 30 MG PO CP24
ORAL_CAPSULE | ORAL | 0 refills | Status: DC
Start: 2020-11-29 — End: 2020-12-25
  Filled 2020-11-29: qty 30, fill #0
  Filled 2020-12-06: qty 30, 30d supply, fill #0

## 2020-11-30 ENCOUNTER — Ambulatory Visit: Payer: No Typology Code available for payment source | Admitting: Family Medicine

## 2020-12-06 ENCOUNTER — Other Ambulatory Visit (HOSPITAL_COMMUNITY): Payer: Self-pay

## 2020-12-06 ENCOUNTER — Other Ambulatory Visit: Payer: Self-pay | Admitting: Family Medicine

## 2020-12-06 DIAGNOSIS — I10 Essential (primary) hypertension: Secondary | ICD-10-CM

## 2020-12-06 MED ORDER — METOPROLOL SUCCINATE ER 50 MG PO TB24
ORAL_TABLET | ORAL | 1 refills | Status: DC
  Filled 2020-12-06: qty 90, 90d supply, fill #0
  Filled 2021-01-28: qty 90, 90d supply, fill #1

## 2020-12-25 ENCOUNTER — Other Ambulatory Visit: Payer: Self-pay

## 2020-12-25 ENCOUNTER — Other Ambulatory Visit (HOSPITAL_COMMUNITY): Payer: Self-pay

## 2020-12-25 ENCOUNTER — Encounter: Payer: Self-pay | Admitting: Family Medicine

## 2020-12-25 ENCOUNTER — Ambulatory Visit (INDEPENDENT_AMBULATORY_CARE_PROVIDER_SITE_OTHER): Payer: Self-pay | Admitting: Family Medicine

## 2020-12-25 VITALS — BP 112/78 | HR 84 | Temp 98.1°F | Ht 64.0 in | Wt 157.5 lb

## 2020-12-25 DIAGNOSIS — I10 Essential (primary) hypertension: Secondary | ICD-10-CM

## 2020-12-25 DIAGNOSIS — F900 Attention-deficit hyperactivity disorder, predominantly inattentive type: Secondary | ICD-10-CM

## 2020-12-25 DIAGNOSIS — M79672 Pain in left foot: Secondary | ICD-10-CM

## 2020-12-25 MED ORDER — AMPHETAMINE-DEXTROAMPHETAMINE 30 MG PO TABS
ORAL_TABLET | ORAL | 0 refills | Status: DC
  Filled 2020-12-25 (×2): qty 45, 30d supply, fill #0

## 2020-12-25 NOTE — Patient Instructions (Addendum)
Keep the diet clean and stay active.  If you do not hear anything about your referral in the next 1-2 weeks, call our office and ask for an update.  Ice/cold pack over area for 10-15 min twice daily.  Go back on the splint/[place] sock. Arch support is helpful. I can do a shot if needed, just let me know.   Let us know if you need anything.  Plantar Fasciitis Stretches/exercises Do exercises exactly as told by your health care provider and adjust them as directed. It is normal to feel mild stretching, pulling, tightness, or discomfort as you do these exercises, but you should stop right away if you feel sudden pain or your pain gets worse.   Stretching and range of motion exercises These exercises warm up your muscles and joints and improve the movement and flexibility of your foot. These exercises also help to relieve pain.  Exercise A: Plantar fascia stretch Sit with your left / right leg crossed over your opposite knee. Hold your heel with one hand with that thumb near your arch. With your other hand, hold your toes and gently pull them back toward the top of your foot. You should feel a stretch on the bottom of your toes or your foot or both. Hold this stretch for 30 seconds. Slowly release your toes and return to the starting position. Repeat 2 times. Complete this exercise 3 times per week.  Exercise B: Gastroc, standing Stand with your hands against a wall. Extend your left / right leg behind you, and bend your front knee slightly. Keeping your heels on the floor and keeping your back knee straight, shift your weight toward the wall without arching your back. You should feel a gentle stretch in your left / right calf. Hold this position for 30 seconds. Repeat 2 times. Complete this exercise 3 times a week. Exercise C: Soleus, standing Stand with your hands against a wall. Extend your left / right leg behind you, and bend your front knee slightly. Keeping your heels on the  floor, bend your back knee and slightly shift your weight over the back leg. You should feel a gentle stretch deep in your calf. Hold this position for 30 seconds. Repeat 2 times. Complete this exercise 3 times per week. Exercise D: Gastrocsoleus, standing Stand with the ball of your left / right foot on a step. The ball of your foot is on the walking surface, right under your toes. Keep your other foot firmly on the same step. Hold onto the wall or a railing for balance. Slowly lift your other foot, allowing your body weight to press your heel down over the edge of the step. You should feel a stretch in your left / right calf. Hold this position for 30 seconds. Return both feet to the step. Repeat this exercise with a slight bend in your left / right knee. Repeat 2 times with your left / right knee straight and 2times with your left / right knee bent. Complete this exercise 3 times a week.  Balance exercise This exercise builds your balance and strength control of your arch to help take pressure off your plantar fascia. Exercise E: Single leg stand Without shoes, stand near a railing or in a doorway. You may hold onto the railing or door frame as needed. Stand on your left / right foot. Keep your big toe down on the floor and try to keep your arch lifted. Do not let your foot roll inward. Hold  this position for 30 seconds. If this exercise is too easy, you can try it with your eyes closed or while standing on a pillow. Repeat 2 times. Complete this exercise 3 times per week. This information is not intended to replace advice given to you by your health care provider. Make sure you discuss any questions you have with your health care provider. Document Released: 02/18/2005 Document Revised: 10/24/2015 Document Reviewed: 01/02/2015 Elsevier Interactive Patient Education  2017 [employer].

## 2020-12-25 NOTE — Progress Notes (Signed)
Chief Complaint  Patient presents with   Follow-up    Medication adderall    Nichole Mcintosh is 35 y.o. female here for ADHD follow up.  Patient is currently on Adderall XR 30 mg/d and compliance is excellent. Symptoms include inattention, forgetfulness. Side effects include: None. Patient believes their dose should be increased/changed. Denies tics, weight loss, difficulties with sleep, self-medication, alcohol/drug abuse, chest pain, or palpitations.  Hypertension Patient presents for hypertension follow up. She does not routinly monitor home blood pressures. She is compliant with medications- Norvasc 10 mg/d, Toprol XL 50 mg/d. Patient has these side effects of medication: none She is adhering to a healthy diet overall. Exercise: walking No CP or SOB.   Plantar fasciitis- starting to come back. Had injection around 10 yrs ago that did work, would prefer to avoid. Tried posterior splints. No bruising, swelling, redness. Works as Engineer, civil (consulting), on Health visitor a lot.    Past Medical History:  Diagnosis Date   Attention deficit hyperactivity disorder (ADHD) 11/03/2015   Colitis    Depression    Diverticulitis    GERD (gastroesophageal reflux disease)    Headache    Heart murmur    as a child    Hypertension    PONV (postoperative nausea and vomiting)     BP 112/78   Pulse 84   Temp 98.1 F (36.7 C) (Oral)   Ht 5\' 4"  (1.626 m)   Wt 157 lb 8 oz (71.4 kg)   SpO2 99%   BMI 27.03 kg/m  Gen- awake, alert, appearing stated age Heart- RRR Lungs- CTAB, no accessory muscle use Neuro- no facial tics Psych- age appropriate judgment and insight, normal mood and affect  Attention deficit hyperactivity disorder (ADHD), predominantly inattentive type  Essential hypertension  Left foot pain  Chronic, not as controlled w the new generic. Will change to short acting. Take 30 mg in AM, 15 mg at lunch. Chronic, controlled. Cont Norvasc 10 mg/d, Toprol XL 50 mg/d.  Heat, ice, Tylenol,  stretches/exercises. Will inject if needed.  F/u in 6 mo for CPE. Pt voiced understanding and agreement to the plan.  [place], DO 12/25/20 11:56 AM

## 2021-01-28 ENCOUNTER — Other Ambulatory Visit: Payer: Self-pay | Admitting: Family Medicine

## 2021-01-28 DIAGNOSIS — I10 Essential (primary) hypertension: Secondary | ICD-10-CM

## 2021-01-29 ENCOUNTER — Other Ambulatory Visit (HOSPITAL_COMMUNITY): Payer: Self-pay

## 2021-01-29 ENCOUNTER — Other Ambulatory Visit: Payer: Self-pay | Admitting: Family Medicine

## 2021-01-29 MED ORDER — AMPHETAMINE-DEXTROAMPHETAMINE 30 MG PO TABS: ORAL_TABLET | ORAL | 0 refills | Status: DC

## 2021-01-29 MED ORDER — AMLODIPINE BESYLATE 10 MG PO TABS
10.0000 mg | ORAL_TABLET | Freq: Every day | ORAL | 0 refills | Status: DC
  Filled 2021-01-29: qty 90, 90d supply, fill #0

## 2021-01-29 MED ORDER — AMPHETAMINE-DEXTROAMPHETAMINE 30 MG PO TABS
ORAL_TABLET | ORAL | 0 refills | Status: DC
  Filled 2021-03-03: qty 45, 30d supply, fill #0

## 2021-01-29 MED ORDER — AMPHETAMINE-DEXTROAMPHETAMINE 30 MG PO TABS
ORAL_TABLET | ORAL | 0 refills | Status: DC
  Filled 2021-01-29: qty 45, 30d supply, fill #0

## 2021-03-05 ENCOUNTER — Other Ambulatory Visit (HOSPITAL_COMMUNITY): Payer: Self-pay

## 2021-03-19 IMAGING — DX RIGHT HAND - 2 VIEW
2 series · 2 of 2 positions shown · non-contrast
Comparison: No recent.

CLINICAL DATA: Right hand swelling.  Pain third and fourth digits.

EXAM:
RIGHT HAND - 2 VIEW

[hand pa]
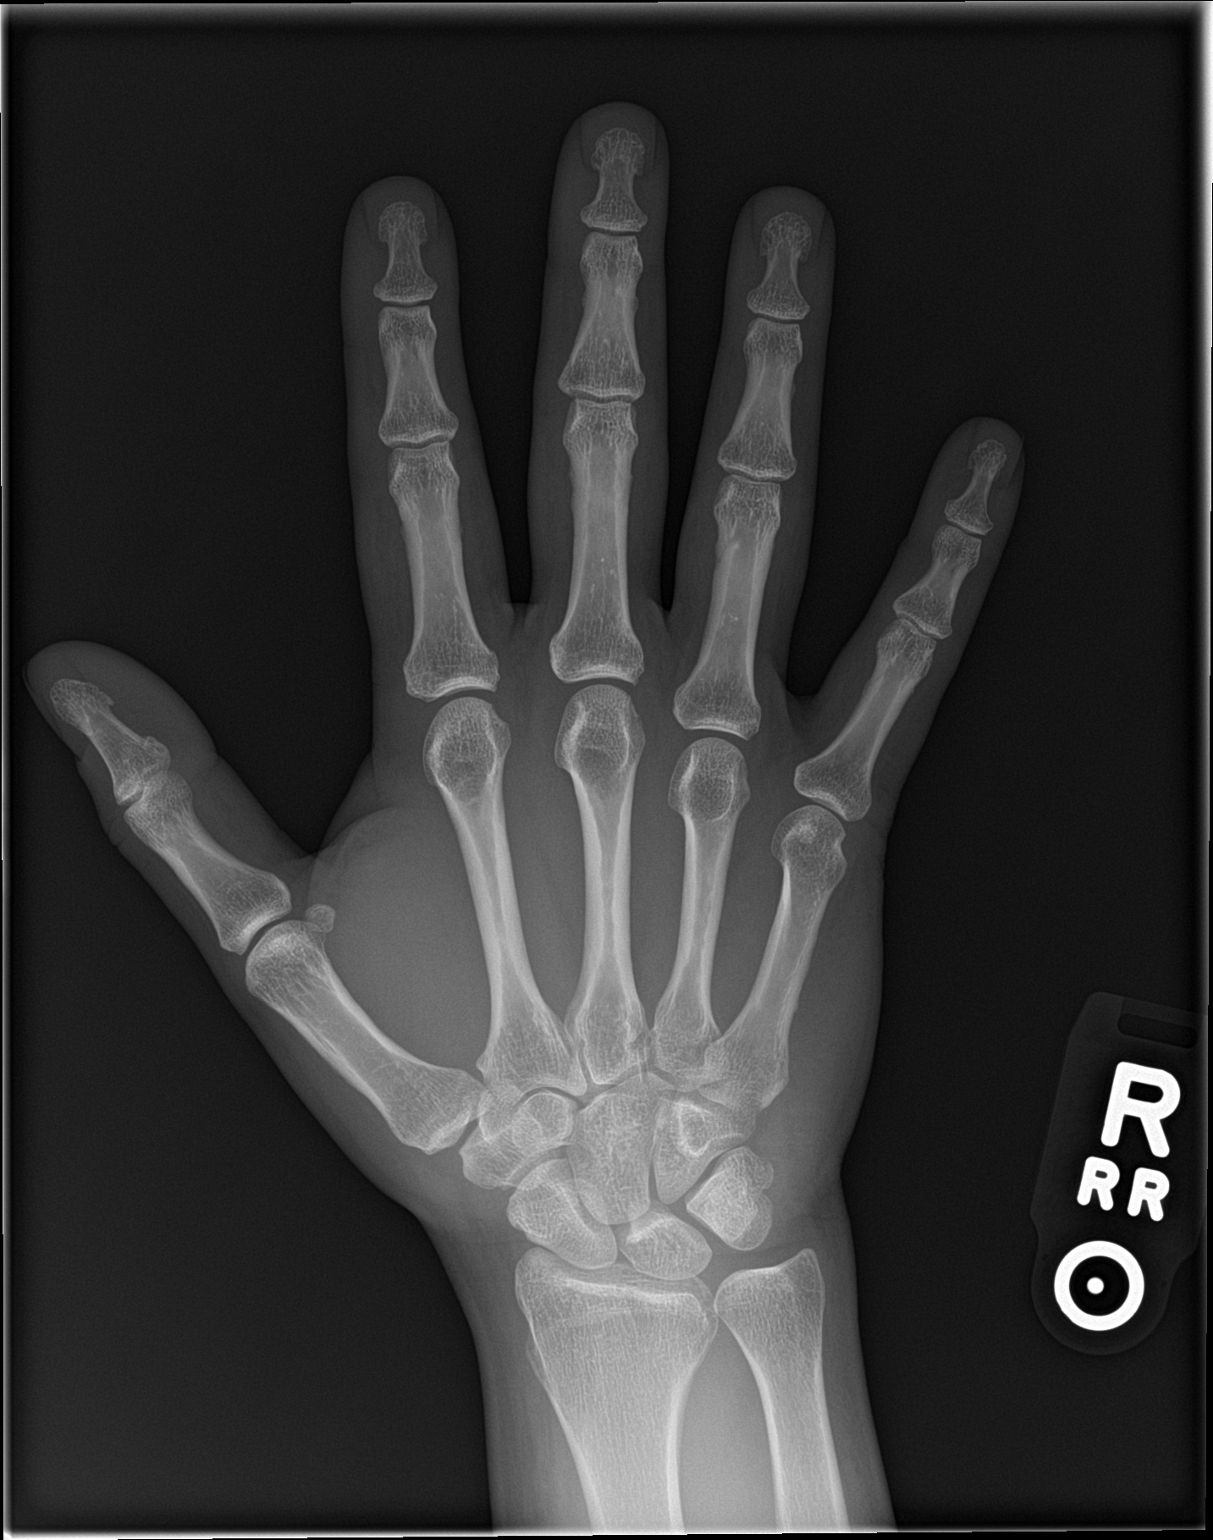

[hand lat]
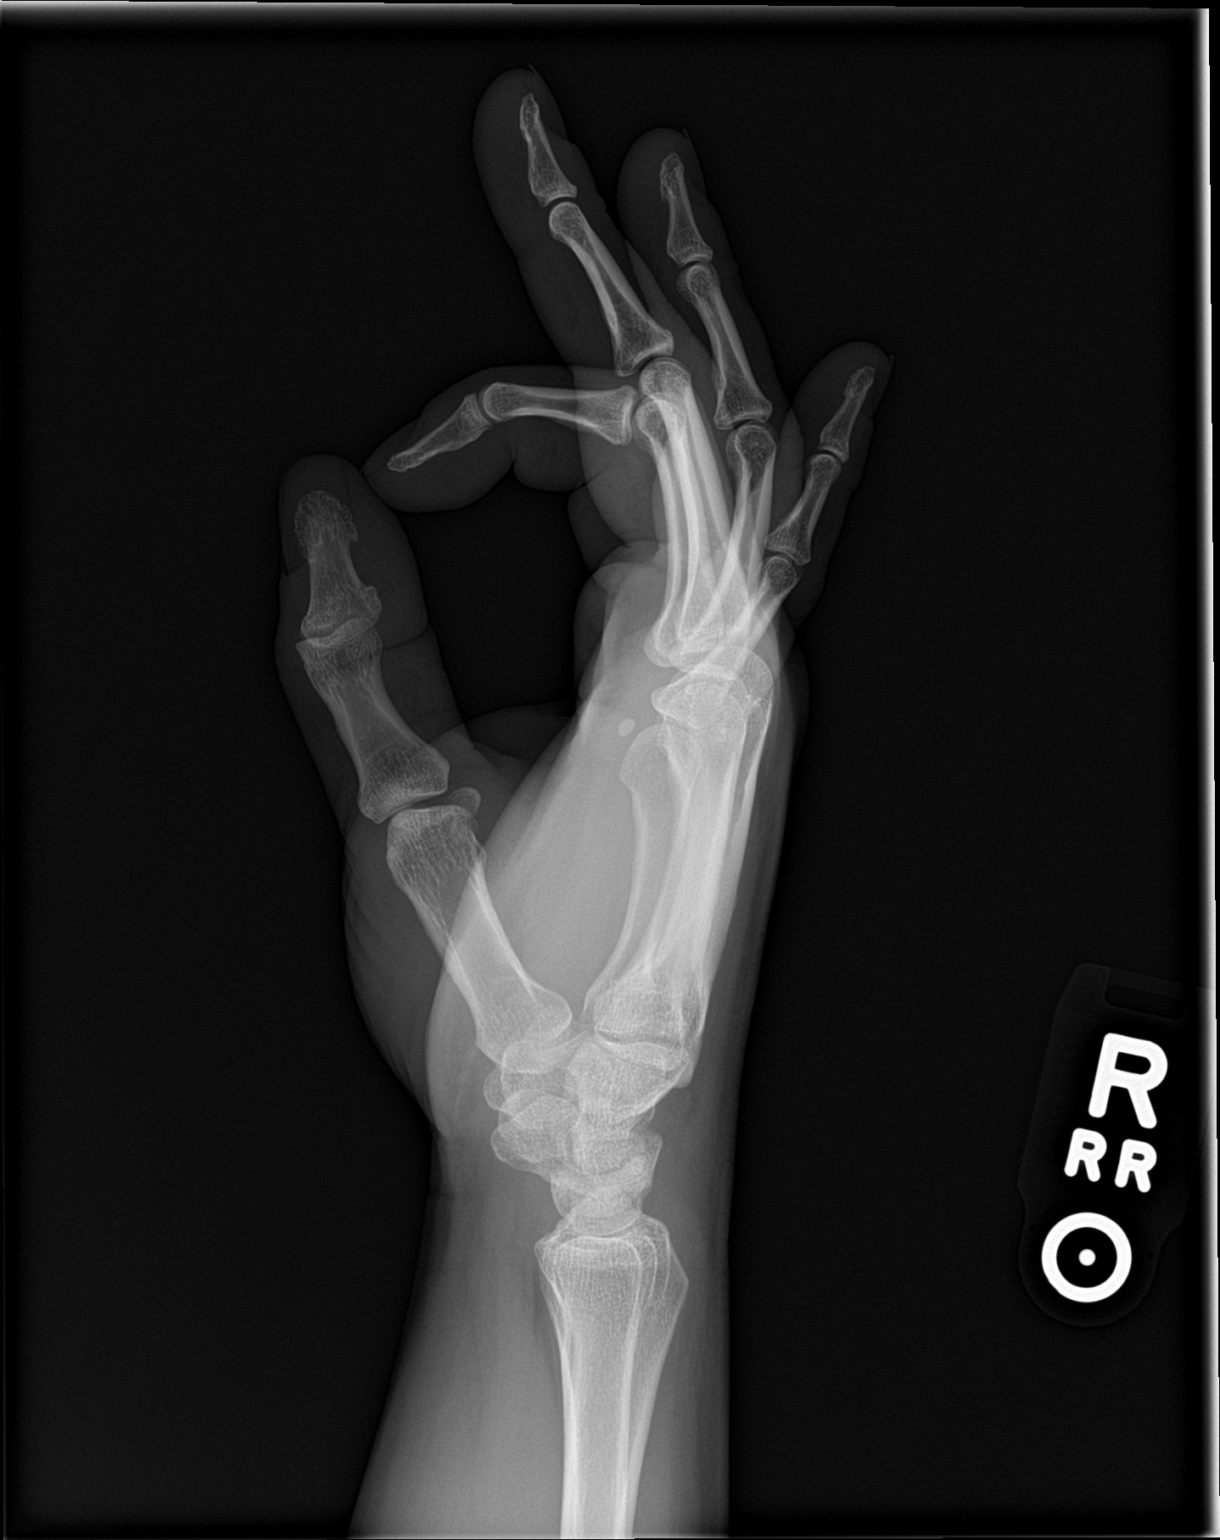

[2 of 2 positions shown; findings below may reference images not displayed]

FINDINGS: No acute bony or joint abnormality. No evidence of fracture or
dislocation. Soft tissue swelling cannot be excluded. No radiopaque
foreign body.
IMPRESSION: Soft tissue swelling cannot be excluded. No radiopaque foreign body.
No acute abnormality.

## 2021-04-27 ENCOUNTER — Other Ambulatory Visit (HOSPITAL_COMMUNITY): Payer: Self-pay

## 2021-04-27 ENCOUNTER — Other Ambulatory Visit: Payer: Self-pay | Admitting: Family Medicine

## 2021-04-27 DIAGNOSIS — I10 Essential (primary) hypertension: Secondary | ICD-10-CM

## 2021-04-27 MED ORDER — OMEPRAZOLE 40 MG PO CPDR
DELAYED_RELEASE_CAPSULE | Freq: Every day | ORAL | 1 refills | Status: DC
  Filled 2021-04-27: qty 90, 90d supply, fill #0
  Filled 2021-07-19: qty 90, 90d supply, fill #1

## 2021-04-27 MED ORDER — AMPHETAMINE-DEXTROAMPHETAMINE 30 MG PO TABS
ORAL_TABLET | ORAL | 0 refills | Status: DC
  Filled 2021-04-27: qty 45, 30d supply, fill #0

## 2021-04-27 MED ORDER — AMPHETAMINE-DEXTROAMPHETAMINE 30 MG PO TABS
ORAL_TABLET | ORAL | 0 refills | Status: DC
  Filled 2021-06-06: qty 45, 30d supply, fill #0

## 2021-04-27 MED ORDER — AMLODIPINE BESYLATE 10 MG PO TABS
10.0000 mg | ORAL_TABLET | Freq: Every day | ORAL | 0 refills | Status: DC
  Filled 2021-04-27: qty 90, 90d supply, fill #0

## 2021-04-27 MED ORDER — METOPROLOL SUCCINATE ER 50 MG PO TB24
ORAL_TABLET | ORAL | 1 refills | Status: DC
  Filled 2021-04-27: qty 90, 90d supply, fill #0
  Filled 2021-07-19: qty 90, 90d supply, fill #1

## 2021-04-27 MED ORDER — AMPHETAMINE-DEXTROAMPHETAMINE 30 MG PO TABS
ORAL_TABLET | ORAL | 0 refills | Status: DC
Start: 2021-06-26 — End: 2021-09-06
  Filled 2021-07-19: qty 45, 30d supply, fill #0

## 2021-04-27 NOTE — Telephone Encounter (Signed)
Last OV--12/25/20 Last RF--03/30/21 UDS/CSC--08/29/2017

## 2021-04-30 ENCOUNTER — Other Ambulatory Visit (HOSPITAL_COMMUNITY): Payer: Self-pay

## 2021-05-01 ENCOUNTER — Telehealth: Payer: Self-pay

## 2021-05-01 ENCOUNTER — Other Ambulatory Visit (HOSPITAL_COMMUNITY): Payer: Self-pay

## 2021-05-01 MED ORDER — AMPHETAMINE-DEXTROAMPHETAMINE 20 MG PO TABS
ORAL_TABLET | ORAL | 0 refills | Status: DC
  Filled 2021-05-01: qty 60, 30d supply, fill #0

## 2021-05-01 NOTE — Telephone Encounter (Signed)
Pt says her pharmacy- [facility] only has Adderall 20 mg in stock and does not have that much left. She asks for this to be called in.

## 2021-05-01 NOTE — Addendum Note (Signed)
Addended by: Radene Gunning on: 05/01/2021 01:27 PM   Modules accepted: Orders

## 2021-06-06 ENCOUNTER — Other Ambulatory Visit (HOSPITAL_COMMUNITY): Payer: Self-pay

## 2021-07-19 ENCOUNTER — Other Ambulatory Visit: Payer: Self-pay | Admitting: Family Medicine

## 2021-07-19 ENCOUNTER — Other Ambulatory Visit (HOSPITAL_COMMUNITY): Payer: Self-pay

## 2021-07-19 DIAGNOSIS — I10 Essential (primary) hypertension: Secondary | ICD-10-CM

## 2021-07-20 ENCOUNTER — Other Ambulatory Visit (HOSPITAL_COMMUNITY): Payer: Self-pay

## 2021-09-06 ENCOUNTER — Other Ambulatory Visit (HOSPITAL_COMMUNITY): Payer: Self-pay

## 2021-09-06 ENCOUNTER — Other Ambulatory Visit: Payer: Self-pay | Admitting: Family Medicine

## 2021-09-06 DIAGNOSIS — I10 Essential (primary) hypertension: Secondary | ICD-10-CM

## 2021-09-06 MED ORDER — AMPHETAMINE-DEXTROAMPHETAMINE 30 MG PO TABS: ORAL_TABLET | ORAL | 0 refills | Status: DC

## 2021-09-06 MED ORDER — AMLODIPINE BESYLATE 10 MG PO TABS
10.0000 mg | ORAL_TABLET | Freq: Every day | ORAL | 2 refills | Status: DC
  Filled 2021-09-06: qty 90, 90d supply, fill #0
  Filled 2021-12-17: qty 90, 90d supply, fill #1
  Filled 2022-03-16: qty 90, 90d supply, fill #2

## 2021-09-06 MED ORDER — AMPHETAMINE-DEXTROAMPHETAMINE 30 MG PO TABS
ORAL_TABLET | ORAL | 0 refills | Status: DC
  Filled 2021-10-29: qty 45, 30d supply, fill #0

## 2021-09-06 MED ORDER — AMPHETAMINE-DEXTROAMPHETAMINE 30 MG PO TABS
ORAL_TABLET | ORAL | 0 refills | Status: DC
  Filled 2021-09-06: qty 45, 30d supply, fill #0

## 2021-10-10 ENCOUNTER — Ambulatory Visit: Payer: Self-pay | Admitting: Family Medicine

## 2021-10-29 ENCOUNTER — Other Ambulatory Visit: Payer: Self-pay | Admitting: Family Medicine

## 2021-10-29 ENCOUNTER — Other Ambulatory Visit (HOSPITAL_COMMUNITY): Payer: Self-pay

## 2021-10-29 MED ORDER — OMEPRAZOLE 40 MG PO CPDR
DELAYED_RELEASE_CAPSULE | Freq: Every day | ORAL | 1 refills | Status: DC
  Filled 2021-10-29: qty 90, 90d supply, fill #0
  Filled 2021-12-17: qty 90, 90d supply, fill #1

## 2021-11-19 ENCOUNTER — Other Ambulatory Visit (HOSPITAL_COMMUNITY): Payer: Self-pay

## 2021-11-19 ENCOUNTER — Ambulatory Visit (INDEPENDENT_AMBULATORY_CARE_PROVIDER_SITE_OTHER): Payer: Self-pay | Admitting: Family Medicine

## 2021-11-19 ENCOUNTER — Encounter: Payer: Self-pay | Admitting: Family Medicine

## 2021-11-19 VITALS — BP 128/88 | HR 96 | Temp 98.1°F | Resp 18 | Ht 64.0 in | Wt 155.4 lb

## 2021-11-19 DIAGNOSIS — E785 Hyperlipidemia, unspecified: Secondary | ICD-10-CM

## 2021-11-19 DIAGNOSIS — F900 Attention-deficit hyperactivity disorder, predominantly inattentive type: Secondary | ICD-10-CM

## 2021-11-19 DIAGNOSIS — I10 Essential (primary) hypertension: Secondary | ICD-10-CM

## 2021-11-19 MED ORDER — AMPHETAMINE-DEXTROAMPHET ER 30 MG PO CP24
30.0000 mg | ORAL_CAPSULE | ORAL | 0 refills | Status: DC
  Filled 2022-01-25: qty 30, 30d supply, fill #0

## 2021-11-19 MED ORDER — AMPHETAMINE-DEXTROAMPHET ER 30 MG PO CP24
30.0000 mg | ORAL_CAPSULE | ORAL | 0 refills | Status: DC
  Filled 2021-11-19: qty 30, 30d supply, fill #0

## 2021-11-19 MED ORDER — AMPHETAMINE-DEXTROAMPHET ER 30 MG PO CP24
30.0000 mg | ORAL_CAPSULE | ORAL | 0 refills | Status: DC
  Filled 2021-12-21: qty 30, 30d supply, fill #0

## 2021-11-19 MED ORDER — METOPROLOL SUCCINATE ER 50 MG PO TB24
ORAL_TABLET | ORAL | 1 refills | Status: DC
  Filled 2021-11-19: qty 90, fill #0
  Filled 2021-12-17: qty 90, 90d supply, fill #0
  Filled 2022-03-16: qty 90, 90d supply, fill #1

## 2021-11-19 NOTE — Progress Notes (Signed)
Chief Complaint  Patient presents with   ADHD   Hypertension   Follow-up    Nichole Mcintosh is 36 y.o. female here for ADHD follow up.  Patient is currently on Adderall 30 mg in AM and 15 mg in PM and compliance is excellent. Symptoms include inattention. Side effects include: none. Patient believes their dose should be changed to long acting. Denies tics, weight loss, difficulties with sleep, self-medication, alcohol/drug abuse, chest pain, or palpitations.  Hypertension Patient presents for hypertension follow up. She does not monitor home blood pressures. She is compliant with medication- Norvasc 10 mg/d, Toprol XL 50 mg/d. Patient has these side effects of medication: none She is sometimes adhering to a healthy diet overall. Exercise: active at work No CP or SOB.   Past Medical History:  Diagnosis Date   Attention deficit hyperactivity disorder (ADHD) 11/03/2015   Colitis    Depression    Diverticulitis    GERD (gastroesophageal reflux disease)    Headache    Heart murmur    as a child    Hypertension    PONV (postoperative nausea and vomiting)     BP 128/88 (BP Location: Left Arm, Patient Position: Sitting, Cuff Size: Normal)   Pulse 96   Temp 98.1 F (36.7 C) (Oral)   Resp 18   Ht 5\' 4"  (1.626 m)   Wt 155 lb 6.4 oz (70.5 kg)   SpO2 97%   BMI 26.67 kg/m  Gen- awake, alert, appearing stated age Heart- RRR Lungs- CTAB, no accessory muscle use Neuro- no facial tics Psych- age appropriate judgment and insight, normal mood and affect  Attention deficit hyperactivity disorder (ADHD), predominantly inattentive type - Plan: amphetamine-dextroamphetamine (ADDERALL XR) 30 MG 24 hr capsule, amphetamine-dextroamphetamine (ADDERALL XR) 30 MG 24 hr capsule, amphetamine-dextroamphetamine (ADDERALL XR) 30 MG 24 hr capsule  Essential hypertension - Plan: metoprolol succinate (TOPROL-XL) 50 MG 24 hr tablet  Hyperlipidemia, unspecified hyperlipidemia type - Plan: Lipid  panel  Chronic, stable overall. W work schedule, difficult to get second dosage of Adderall, interested in changing to long acting.  Chronic, stable. Cont Norvasc 10 mg/d, Toprol XL 50 mg/d. Counseled on diet/exercise. Ck lipids.  F/u in 6 mo.  Pt voiced understanding and agreement to the plan.  [place], DO 11/19/21 1:48 PM

## 2021-11-19 NOTE — Patient Instructions (Signed)
Keep the diet clean and stay active.  Let us know if you need anything. 

## 2021-12-17 ENCOUNTER — Other Ambulatory Visit (HOSPITAL_COMMUNITY): Payer: Self-pay

## 2021-12-17 ENCOUNTER — Other Ambulatory Visit: Payer: Self-pay | Admitting: Family Medicine

## 2021-12-17 DIAGNOSIS — F900 Attention-deficit hyperactivity disorder, predominantly inattentive type: Secondary | ICD-10-CM

## 2021-12-21 ENCOUNTER — Other Ambulatory Visit (HOSPITAL_COMMUNITY): Payer: Self-pay

## 2021-12-28 ENCOUNTER — Other Ambulatory Visit (HOSPITAL_COMMUNITY): Payer: Self-pay

## 2022-01-25 ENCOUNTER — Other Ambulatory Visit (HOSPITAL_COMMUNITY): Payer: Self-pay

## 2022-01-28 ENCOUNTER — Other Ambulatory Visit (HOSPITAL_COMMUNITY): Payer: Self-pay

## 2022-02-28 ENCOUNTER — Other Ambulatory Visit (HOSPITAL_COMMUNITY): Payer: Self-pay

## 2022-02-28 ENCOUNTER — Other Ambulatory Visit: Payer: Self-pay | Admitting: Family Medicine

## 2022-02-28 ENCOUNTER — Other Ambulatory Visit: Payer: Self-pay

## 2022-02-28 DIAGNOSIS — F900 Attention-deficit hyperactivity disorder, predominantly inattentive type: Secondary | ICD-10-CM

## 2022-02-28 MED ORDER — AMPHETAMINE-DEXTROAMPHET ER 30 MG PO CP24
30.0000 mg | ORAL_CAPSULE | ORAL | 0 refills | Status: DC
  Filled 2022-02-28 – 2022-03-02 (×3): qty 30, 30d supply, fill #0

## 2022-02-28 MED ORDER — AMPHETAMINE-DEXTROAMPHET ER 30 MG PO CP24
30.0000 mg | ORAL_CAPSULE | ORAL | 0 refills | Status: DC
  Filled 2022-04-06: qty 30, 30d supply, fill #0

## 2022-02-28 MED ORDER — AMPHETAMINE-DEXTROAMPHET ER 30 MG PO CP24: 30.0000 mg | ORAL_CAPSULE | ORAL | 0 refills | Status: DC

## 2022-03-01 ENCOUNTER — Other Ambulatory Visit: Payer: Self-pay

## 2022-03-01 ENCOUNTER — Other Ambulatory Visit (HOSPITAL_COMMUNITY): Payer: Self-pay

## 2022-03-02 ENCOUNTER — Other Ambulatory Visit (HOSPITAL_COMMUNITY): Payer: Self-pay

## 2022-03-05 ENCOUNTER — Other Ambulatory Visit: Payer: Self-pay

## 2022-03-05 ENCOUNTER — Other Ambulatory Visit (HOSPITAL_COMMUNITY): Payer: Self-pay

## 2022-03-16 ENCOUNTER — Other Ambulatory Visit (HOSPITAL_COMMUNITY): Payer: Self-pay

## 2022-03-16 ENCOUNTER — Encounter (HOSPITAL_COMMUNITY): Payer: Self-pay

## 2022-04-06 ENCOUNTER — Other Ambulatory Visit: Payer: Self-pay | Admitting: Family Medicine

## 2022-04-08 ENCOUNTER — Other Ambulatory Visit: Payer: Self-pay

## 2022-04-08 ENCOUNTER — Other Ambulatory Visit (HOSPITAL_COMMUNITY): Payer: Self-pay

## 2022-04-08 MED ORDER — OMEPRAZOLE 40 MG PO CPDR
40.0000 mg | DELAYED_RELEASE_CAPSULE | Freq: Every day | ORAL | 1 refills | Status: DC
  Filled 2022-04-08: qty 90, 90d supply, fill #0
  Filled 2022-07-17: qty 90, 90d supply, fill #1

## 2022-05-09 ENCOUNTER — Encounter: Payer: Self-pay | Admitting: Family Medicine

## 2022-05-09 ENCOUNTER — Other Ambulatory Visit: Payer: Self-pay

## 2022-05-09 ENCOUNTER — Other Ambulatory Visit: Payer: Self-pay | Admitting: Family Medicine

## 2022-05-09 DIAGNOSIS — F900 Attention-deficit hyperactivity disorder, predominantly inattentive type: Secondary | ICD-10-CM

## 2022-05-09 MED ORDER — AMPHETAMINE-DEXTROAMPHET ER 30 MG PO CP24
30.0000 mg | ORAL_CAPSULE | ORAL | 0 refills | Status: DC
  Filled 2022-05-09: qty 30, 30d supply, fill #0

## 2022-05-09 NOTE — Telephone Encounter (Signed)
30 d sent, please sched appt in a couple weeks for her CPE if she has ins or a med ck if not. Ty.

## 2022-05-09 NOTE — Telephone Encounter (Signed)
Requesting: Adderall XR '30mg'$   Contract: 04/03/20 UDS: 04/03/20 Last Visit: 11/19/21 Next Visit: None Last Refill: 11/19/21 #30 and 0RF (x3)  Please Advise

## 2022-05-09 NOTE — Telephone Encounter (Signed)
Letter sent to Pt to schedule CPX.

## 2022-05-10 ENCOUNTER — Other Ambulatory Visit: Payer: Self-pay

## 2022-05-10 ENCOUNTER — Other Ambulatory Visit (HOSPITAL_COMMUNITY): Payer: Self-pay

## 2022-05-13 ENCOUNTER — Other Ambulatory Visit (HOSPITAL_COMMUNITY): Payer: Self-pay

## 2022-05-22 ENCOUNTER — Other Ambulatory Visit (HOSPITAL_COMMUNITY): Payer: Self-pay

## 2022-05-22 ENCOUNTER — Ambulatory Visit: Payer: BC Managed Care – PPO | Admitting: Family Medicine

## 2022-05-22 ENCOUNTER — Encounter: Payer: Self-pay | Admitting: Family Medicine

## 2022-05-22 VITALS — BP 118/83 | HR 83 | Temp 97.9°F | Ht 64.0 in | Wt 154.1 lb

## 2022-05-22 DIAGNOSIS — F900 Attention-deficit hyperactivity disorder, predominantly inattentive type: Secondary | ICD-10-CM

## 2022-05-22 DIAGNOSIS — Z23 Encounter for immunization: Secondary | ICD-10-CM | POA: Diagnosis not present

## 2022-05-22 DIAGNOSIS — I10 Essential (primary) hypertension: Secondary | ICD-10-CM | POA: Diagnosis not present

## 2022-05-22 MED ORDER — AMPHETAMINE-DEXTROAMPHET ER 30 MG PO CP24
30.0000 mg | ORAL_CAPSULE | ORAL | 0 refills | Status: DC
  Filled 2022-07-17: qty 30, 30d supply, fill #0

## 2022-05-22 MED ORDER — AMPHETAMINE-DEXTROAMPHET ER 30 MG PO CP24
30.0000 mg | ORAL_CAPSULE | ORAL | 0 refills | Status: DC
  Filled 2022-05-22 – 2022-06-13 (×2): qty 30, 30d supply, fill #0

## 2022-05-22 MED ORDER — AMPHETAMINE-DEXTROAMPHET ER 30 MG PO CP24
30.0000 mg | ORAL_CAPSULE | ORAL | 0 refills | Status: DC
  Filled 2022-08-12 – 2022-08-14 (×4): qty 30, 30d supply, fill #0

## 2022-05-22 NOTE — Progress Notes (Signed)
Chief Complaint  Patient presents with   Follow-up    Medication Needs to update her tdap today    Nichole Mcintosh is 37 y.o. female here for ADHD follow up.  Patient is currently on Adderall XR 30 mg/d and compliance is excellent. Symptoms are well controlled. . Side effects include: none. Patient believes their dose should be not significantly changed. Denies tics, weight loss, difficulties with sleep, self-medication, alcohol/drug abuse, chest pain, or palpitations.  Hypertension Patient presents for hypertension follow up. She does not monitor home blood pressures. She is compliant with medications- Norvasc 10 mg/d, Toprol XL 50 mg/d. Patient has these side effects of medication: none She is adhering to a healthy diet overall. Exercise: walking No Cp or SOB.   Past Medical History:  Diagnosis Date   Attention deficit hyperactivity disorder (ADHD) 11/03/2015   Colitis    Depression    Diverticulitis    GERD (gastroesophageal reflux disease)    Headache    Heart murmur    as a child    Hypertension    PONV (postoperative nausea and vomiting)     BP 118/83 (BP Location: Left Arm, Patient Position: Sitting, Cuff Size: Normal)   Pulse 83   Temp 97.9 F (36.6 C) (Oral)   Ht 5\' 4"  (1.626 m)   Wt 154 lb 2 oz (69.9 kg)   SpO2 98%   BMI 26.46 kg/m  Gen- awake, alert, appearing stated age Heart- RRR Lungs- CTAB, no accessory muscle use Neuro- no facial tics Psych- age appropriate judgment and insight, normal mood and affect  Attention deficit hyperactivity disorder (ADHD), predominantly inattentive type - Plan: amphetamine-dextroamphetamine (ADDERALL XR) 30 MG 24 hr capsule, amphetamine-dextroamphetamine (ADDERALL XR) 30 MG 24 hr capsule, amphetamine-dextroamphetamine (ADDERALL XR) 30 MG 24 hr capsule  Essential hypertension  Chronic, stable.  Continue Adderall XR 30 mg daily. Chronic, stable.  Continue Norvasc 10 mg daily, metoprolol succinate 50 mg daily.  Counseled  on diet and exercise. F/u in 6 mo for CPE. Pt voiced understanding and agreement to the plan.  [place], DO 05/22/22 11:04 AM

## 2022-05-22 NOTE — Patient Instructions (Signed)
Keep the diet clean and stay active.  Let us know if you need anything. 

## 2022-05-22 NOTE — Addendum Note (Signed)
Addended by: Sharon Seller B on: 05/22/2022 11:09 AM   Modules accepted: Orders

## 2022-05-23 ENCOUNTER — Other Ambulatory Visit (HOSPITAL_COMMUNITY): Payer: Self-pay

## 2022-06-11 ENCOUNTER — Encounter: Payer: Self-pay | Admitting: Family Medicine

## 2022-06-13 ENCOUNTER — Other Ambulatory Visit: Payer: Self-pay | Admitting: Family Medicine

## 2022-06-13 ENCOUNTER — Other Ambulatory Visit (HOSPITAL_COMMUNITY): Payer: Self-pay

## 2022-06-13 ENCOUNTER — Other Ambulatory Visit (HOSPITAL_COMMUNITY)
Admission: RE | Admit: 2022-06-13 | Discharge: 2022-06-13 | Disposition: A | Discharge status: Home / Self Care | Payer: BC Managed Care – PPO | Source: Ambulatory Visit | Attending: Nurse Practitioner | Admitting: Nurse Practitioner

## 2022-06-13 ENCOUNTER — Encounter: Payer: Self-pay | Admitting: Nurse Practitioner

## 2022-06-13 ENCOUNTER — Other Ambulatory Visit: Payer: Self-pay

## 2022-06-13 ENCOUNTER — Ambulatory Visit: Payer: BC Managed Care – PPO | Admitting: Nurse Practitioner

## 2022-06-13 VITALS — BP 132/74 | HR 88 | Ht 64.0 in | Wt 159.0 lb

## 2022-06-13 DIAGNOSIS — N920 Excessive and frequent menstruation with regular cycle: Secondary | ICD-10-CM

## 2022-06-13 DIAGNOSIS — N946 Dysmenorrhea, unspecified: Secondary | ICD-10-CM

## 2022-06-13 DIAGNOSIS — Z124 Encounter for screening for malignant neoplasm of cervix: Secondary | ICD-10-CM | POA: Diagnosis not present

## 2022-06-13 DIAGNOSIS — I10 Essential (primary) hypertension: Secondary | ICD-10-CM

## 2022-06-13 DIAGNOSIS — M25551 Pain in right hip: Secondary | ICD-10-CM

## 2022-06-13 DIAGNOSIS — Z01419 Encounter for gynecological examination (general) (routine) without abnormal findings: Secondary | ICD-10-CM

## 2022-06-13 MED ORDER — AMLODIPINE BESYLATE 10 MG PO TABS
10.0000 mg | ORAL_TABLET | Freq: Every day | ORAL | 1 refills | Status: DC
Start: 2022-06-13 — End: 2022-12-16
  Filled 2022-06-13: qty 90, 90d supply, fill #0
  Filled 2022-09-13: qty 90, 90d supply, fill #1

## 2022-06-13 MED ORDER — CELECOXIB 200 MG PO CAPS
200.0000 mg | ORAL_CAPSULE | Freq: Two times a day (BID) | ORAL | 0 refills | Status: AC
Start: 2022-06-13 — End: 2022-06-18
  Filled 2022-06-13: qty 10, 5d supply, fill #0

## 2022-06-13 MED ORDER — METOPROLOL SUCCINATE ER 50 MG PO TB24
50.0000 mg | ORAL_TABLET | Freq: Every day | ORAL | 1 refills | Status: DC
Start: 2022-06-13 — End: 2022-12-16
  Filled 2022-06-13: qty 90, 90d supply, fill #0
  Filled 2022-09-13: qty 90, 90d supply, fill #1

## 2022-06-13 NOTE — Progress Notes (Signed)
Nichole Mcintosh 1985-06-14 [id number]   History:  37 y.o. G2P1102 presents as new patient to establish care. Monthly cycles. Menses have become heavier and more painful. It is interfering with her quality of life. 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 Also reports cramping is severe. Feels she is taking way too much Ibuprofen for this. Cramping can start a few days before bleeding and continue throughout menses. Was in the process of scheduling an IUD a couple of years ago but ended up not doing it. Interested now. Abnormal pap history, she thinks it was low grade, no intervention required. Pap normal + HR HPV 09/2017, no repeat. Smoker. ADHD, HTN managed by PCP. C/O right hip pain x 3 days. She laid down to watch the eclipse and thinks she may have pulled something. Pain is intense, worse after sitting or laying for long periods, hurts with lifting. Pain does not radiate down leg, no numbness, tingling or weakness.   Gynecologic History Patient's last menstrual period was 05/22/2022.   Contraception/Family planning: tubal ligation Sexually active: Yes  Health Maintenance Last Pap: 09/17/2017. Results were: Normal + HR HPV Last mammogram: Not indicated Last colonoscopy: 08/01/2016. Results were: Diverticulitis Last Dexa: Not indicated  Past medical history, past surgical history, family history and social history were all reviewed and documented in the EPIC chart. Married. 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 Daughter at [employer] with plans for med school, son taking gap year and working for lawn care business.   ROS:  A ROS was performed and pertinent positives and negatives are included.  Exam:  Vitals:   06/13/22 1123  BP: 132/74  Pulse: 88  SpO2: 100%  Weight: 159 lb (72.1 kg)  Height: 5\' 4"  (1.626 m)   Body mass index is 27.29 kg/m.  General appearance:  Normal Thyroid:  Symmetrical, normal in size, without palpable masses or  nodularity. Respiratory  Auscultation:  Clear without wheezing or rhonchi Cardiovascular  Auscultation:  Regular rate, without rubs, murmurs or gallops  Edema/varicosities:  Not grossly evident Abdominal  Soft,nontender, without masses, guarding or rebound.  Liver/spleen:  No organomegaly noted  Hernia:  None appreciated  Skin  Inspection:  Grossly normal Breasts: Examined lying and sitting.   Right: Without masses, retractions, nipple discharge or axillary adenopathy.   Left: Without masses, retractions, nipple discharge or axillary adenopathy. Genitourinary   Inguinal/mons:  Normal without inguinal adenopathy  External genitalia:  Normal appearing vulva with no masses, tenderness, or lesions  BUS/Urethra/Skene's glands:  Normal  Vagina:  Normal appearing with normal color and discharge, no lesions  Cervix:  Normal appearing without discharge or lesions  Uterus:  Normal in size, shape and contour.  Midline and mobile, nontender  Adnexa/parametria:     Rt: Normal in size, without masses or tenderness.   Lt: Normal in size, without masses or tenderness.  Anus and perineum: Normal  Digital rectal exam: Deferred  Patient informed chaperone available to be present for breast and pelvic exam. Patient has requested no chaperone to be present. Patient has been advised what will be completed during breast and pelvic exam.   Assessment/Plan:  37 y.o. G2P1102 to establish care and discuss IUD and right hip pain.   Well female exam with routine gynecological exam - Education provided on SBEs, importance of preventative screenings, current guidelines, high calcium diet, regular exercise, and multivitamin daily.  Labs with PCP.   Screening for cervical cancer - Plan: Cytology -  PAP( [facility]). Requested records from patient. Abnormal pap history, she thinks it was low grade, no intervention required. Most recent pap 09/2017 normal + HR HPV.  Acute right hip pain - Plan: celecoxib (CELEBREX)  200 MG capsule BID x 5 days. Offered muscle relaxer but she is worried about drowsiness. If no improvement with rest, ice and NSAIDs she will follow up with PCP. Aware may take 4-6 weeks for full improvement.   Menorrhagia with regular cycle - Plan: IUD Insertion  Dysmenorrhea - Plan: IUD Insertion. Mirena.  Return in 1 year for annual.     Olivia Mackie DNP, 12:19 PM 06/13/2022

## 2022-06-17 LAB — CYTOLOGY - PAP
Adequacy: ABSENT
Comment: NEGATIVE
Diagnosis: NEGATIVE
High risk HPV: NEGATIVE

## 2022-07-17 ENCOUNTER — Other Ambulatory Visit: Payer: Self-pay

## 2022-07-18 ENCOUNTER — Ambulatory Visit: Payer: BC Managed Care – PPO | Admitting: Nurse Practitioner

## 2022-07-18 VITALS — BP 114/76

## 2022-07-18 DIAGNOSIS — Z30430 Encounter for insertion of intrauterine contraceptive device: Secondary | ICD-10-CM

## 2022-07-18 DIAGNOSIS — Z01812 Encounter for preprocedural laboratory examination: Secondary | ICD-10-CM | POA: Diagnosis not present

## 2022-07-18 DIAGNOSIS — N920 Excessive and frequent menstruation with regular cycle: Secondary | ICD-10-CM

## 2022-07-18 DIAGNOSIS — N946 Dysmenorrhea, unspecified: Secondary | ICD-10-CM

## 2022-07-18 LAB — PREGNANCY, URINE: Preg Test, Ur: NEGATIVE

## 2022-07-18 MED ORDER — LEVONORGESTREL 20 MCG/DAY IU IUD
1.0000 | INTRAUTERINE_SYSTEM | Freq: Once | INTRAUTERINE | Status: AC
Start: 2022-07-18 — End: 2022-07-18
  Administered 2022-07-18: 1 via INTRAUTERINE

## 2022-07-18 NOTE — Progress Notes (Signed)
   Liara Freund 11-12-1985 [id number]   History:  37 y.o. G2P1102 presents for insertion of MIrena IUD.  Pt has been counseled about risks and benefits as well as complications.  Consent is obtained today.  Patient's last menstrual period was 07/11/2022. STD testing: Declines  Past medical history, past surgical history, family history and social history were all reviewed and documented in the EPIC chart.  ROS:  A ROS was performed and pertinent positives and negatives are included.  Exam: Vitals:   07/18/22 0959  BP: 114/76   There is no height or weight on file to calculate BMI.  Pelvic exam: Vulva:  normal female genitalia Vagina:  normal vagina, no discharge, exudate, lesion, or erythema Cervix:  Non-tender, Negative CMT, no lesions or redness. Uterus:  normal shape, position and consistency    Procedure:  Speculum inserted.   Cervix visualized and cleansed with Betadine x 3.  Tenaculum placed on anterior cervix. Then uterus sounded to 9 cm. IUD inserted easily. Strings trimmed to 3 cm. Minimal bleeding noted.  Pt tolerated the procedure well.  Chaperone present: Raynelle Fanning, CMA   Assessment/Plan:  Insertion of Mirena IUD             UPT neg   Return for recheck 4-6 weeks Pt aware to call for any concerns Pt aware removal due no later than 8 years from insertion date, IUD card given to pt.   Olivia Mackie DNP, 10:15 AM 07/18/2022

## 2022-08-12 ENCOUNTER — Other Ambulatory Visit: Payer: Self-pay

## 2022-08-14 ENCOUNTER — Other Ambulatory Visit: Payer: Self-pay

## 2022-08-14 ENCOUNTER — Other Ambulatory Visit (HOSPITAL_COMMUNITY): Payer: Self-pay

## 2022-08-15 ENCOUNTER — Other Ambulatory Visit: Payer: Self-pay

## 2022-08-19 ENCOUNTER — Ambulatory Visit: Payer: BC Managed Care – PPO | Admitting: Nurse Practitioner

## 2022-08-19 ENCOUNTER — Encounter: Payer: Self-pay | Admitting: Nurse Practitioner

## 2022-08-19 VITALS — BP 132/78 | HR 88 | Wt 158.0 lb

## 2022-08-19 DIAGNOSIS — Z30431 Encounter for routine checking of intrauterine contraceptive device: Secondary | ICD-10-CM

## 2022-08-19 NOTE — Progress Notes (Signed)
     History:  37 y.o. [id number] here today for today for IUD string check; Mirena IUD was placed  07/18/22. No complaints about the IUD. Has had spotting most days but it is lighting up and is brown in color. Had 2 anxiety attacks the first week, so she is unsure if it is related. Noticed improvement in cramping and bleeding with menses.   The following portions of the patient's history were reviewed and updated as appropriate: allergies, current medications, past family history, past medical history, past social history, past surgical history and problem list. Last pap smear on 06/13/2022 was normal, negative HR HPV.  Review of Systems:  Pertinent items are noted in HPI.   Objective:  Physical Exam Blood pressure 132/78, pulse 88, weight 158 lb (71.7 kg), SpO2 100 %. Gen: NAD Abd: Soft, nontender and nondistended Pelvic: Normal appearing external genitalia; normal appearing vaginal mucosa and cervix.  IUD strings visualized, about 2.5 cm in length outside cervix.  Chaperone offered and declined.  Assessment & Plan:  Normal IUD check. Discussed bleeding patterns that can occur with IUD, especially first 3-6 months. Will monitor anxiety.   Patient may keep IUD in place for up to 8 years. May remove sooner if she desires pregnancy, or has side effects within that time.   Wyline Beady, DNP

## 2022-09-13 ENCOUNTER — Other Ambulatory Visit: Payer: Self-pay | Admitting: Family Medicine

## 2022-09-13 ENCOUNTER — Other Ambulatory Visit: Payer: Self-pay

## 2022-09-13 ENCOUNTER — Other Ambulatory Visit (HOSPITAL_COMMUNITY): Payer: Self-pay

## 2022-09-13 DIAGNOSIS — F900 Attention-deficit hyperactivity disorder, predominantly inattentive type: Secondary | ICD-10-CM

## 2022-09-13 MED ORDER — AMPHETAMINE-DEXTROAMPHET ER 30 MG PO CP24
30.0000 mg | ORAL_CAPSULE | ORAL | 0 refills | Status: DC
Start: 2022-10-13 — End: 2022-12-16
  Filled 2022-10-15: qty 30, 30d supply, fill #0

## 2022-09-13 MED ORDER — AMPHETAMINE-DEXTROAMPHET ER 30 MG PO CP24
30.0000 mg | ORAL_CAPSULE | ORAL | 0 refills | Status: DC
Start: 2022-11-12 — End: 2023-01-13
  Filled 2022-11-15: qty 30, 30d supply, fill #0

## 2022-09-13 MED ORDER — AMPHETAMINE-DEXTROAMPHET ER 30 MG PO CP24
30.0000 mg | ORAL_CAPSULE | ORAL | 0 refills | Status: DC
Start: 2022-09-13 — End: 2023-01-13
  Filled 2022-09-13: qty 30, 30d supply, fill #0

## 2022-09-13 NOTE — Telephone Encounter (Signed)
Last OV---05/22/22 Last RF---07/21/22

## 2022-10-13 ENCOUNTER — Other Ambulatory Visit: Payer: Self-pay | Admitting: Family Medicine

## 2022-10-13 DIAGNOSIS — F900 Attention-deficit hyperactivity disorder, predominantly inattentive type: Secondary | ICD-10-CM

## 2022-10-14 ENCOUNTER — Other Ambulatory Visit: Payer: Self-pay

## 2022-10-14 ENCOUNTER — Other Ambulatory Visit (HOSPITAL_COMMUNITY): Payer: Self-pay

## 2022-10-14 MED ORDER — OMEPRAZOLE 40 MG PO CPDR
40.0000 mg | DELAYED_RELEASE_CAPSULE | Freq: Every day | ORAL | 1 refills | Status: DC
  Filled 2022-10-14: qty 90, 90d supply, fill #0
  Filled 2023-01-13: qty 90, 90d supply, fill #1

## 2022-10-14 NOTE — Telephone Encounter (Signed)
Should be refills at the pharmacy

## 2022-10-15 ENCOUNTER — Other Ambulatory Visit (HOSPITAL_COMMUNITY): Payer: Self-pay

## 2022-10-15 ENCOUNTER — Other Ambulatory Visit: Payer: Self-pay

## 2022-11-12 ENCOUNTER — Other Ambulatory Visit: Payer: Self-pay | Admitting: Family Medicine

## 2022-11-12 DIAGNOSIS — F900 Attention-deficit hyperactivity disorder, predominantly inattentive type: Secondary | ICD-10-CM

## 2022-11-12 NOTE — Telephone Encounter (Signed)
Should have refill at the pharmacy

## 2022-11-15 ENCOUNTER — Other Ambulatory Visit: Payer: Self-pay

## 2022-12-16 ENCOUNTER — Other Ambulatory Visit (HOSPITAL_COMMUNITY): Payer: Self-pay

## 2022-12-16 ENCOUNTER — Other Ambulatory Visit: Payer: Self-pay | Admitting: Family Medicine

## 2022-12-16 DIAGNOSIS — I10 Essential (primary) hypertension: Secondary | ICD-10-CM

## 2022-12-16 DIAGNOSIS — F900 Attention-deficit hyperactivity disorder, predominantly inattentive type: Secondary | ICD-10-CM

## 2022-12-16 MED ORDER — AMLODIPINE BESYLATE 10 MG PO TABS
10.0000 mg | ORAL_TABLET | Freq: Every day | ORAL | 0 refills | Status: DC
  Filled 2022-12-16: qty 90, 90d supply, fill #0

## 2022-12-16 MED ORDER — METOPROLOL SUCCINATE ER 50 MG PO TB24
50.0000 mg | ORAL_TABLET | Freq: Every day | ORAL | 0 refills | Status: DC
  Filled 2022-12-16: qty 90, 90d supply, fill #0

## 2022-12-16 MED ORDER — AMPHETAMINE-DEXTROAMPHET ER 30 MG PO CP24
30.0000 mg | ORAL_CAPSULE | ORAL | 0 refills | Status: DC
Start: 2022-12-16 — End: 2023-01-13
  Filled 2022-12-16: qty 30, 30d supply, fill #0

## 2022-12-16 NOTE — Telephone Encounter (Signed)
Requesting: Adderall XR 30mg   Contract: 04/03/20 UDS: 04/03/20 Last Visit: 05/22/22 Next Visit: 12/18/22 Last Refill:09/13/22 #30 and 0RF (x3)  Please Advise

## 2022-12-18 ENCOUNTER — Other Ambulatory Visit: Payer: Self-pay

## 2022-12-18 ENCOUNTER — Ambulatory Visit: Payer: BC Managed Care – PPO | Admitting: Family Medicine

## 2022-12-18 ENCOUNTER — Other Ambulatory Visit (HOSPITAL_COMMUNITY): Payer: Self-pay

## 2022-12-18 VITALS — BP 134/82 | HR 88 | Temp 98.0°F | Resp 16 | Ht 66.0 in | Wt 158.8 lb

## 2022-12-18 DIAGNOSIS — Z79899 Other long term (current) drug therapy: Secondary | ICD-10-CM | POA: Diagnosis not present

## 2022-12-18 DIAGNOSIS — F900 Attention-deficit hyperactivity disorder, predominantly inattentive type: Secondary | ICD-10-CM

## 2022-12-18 DIAGNOSIS — I10 Essential (primary) hypertension: Secondary | ICD-10-CM

## 2022-12-18 MED ORDER — FLUTICASONE PROPIONATE 50 MCG/ACT NA SUSP
2.0000 | Freq: Every day | NASAL | 2 refills | Status: DC
  Filled 2022-12-18: qty 16, 30d supply, fill #0
  Filled 2023-01-13: qty 16, 30d supply, fill #1
  Filled 2023-06-15: qty 16, 30d supply, fill #2

## 2022-12-18 NOTE — Patient Instructions (Signed)
Give [place] 2-3 business days to get the results of your labs back.   Keep the diet clean and stay active.  Please consider adding some weight resistance exercise to your routine. Consider yoga as well.   Let us know if you need anything.

## 2022-12-18 NOTE — Addendum Note (Signed)
Addended by: Radene Gunning on: 12/18/2022 09:06 AM   Modules accepted: Orders

## 2022-12-18 NOTE — Progress Notes (Signed)
Chief Complaint  Patient presents with   Medication Refill     Medication refill    Nichole Mcintosh is 37 y.o. female here for ADHD follow up.  Patient is currently on Adderall XR 30 mg/d and compliance is excellent. Symptoms are controlled.  Side effects include none. Patient believes their dose should be unchanged. Denies tics, weight loss, difficulties with sleep, self-medication, alcohol/drug abuse, chest pain, or palpitations.  Hypertension Patient presents for hypertension follow up. She does not monitor home blood pressures. She is compliant with medications- Toprol XL 50 mg/d, Norvasc 10 mg/d. Patient has these side effects of medication: none She is not adhering to a healthy diet overall. Exercise: walking No CP or SOB.   Past Medical History:  Diagnosis Date   Attention deficit hyperactivity disorder (ADHD) 11/03/2015   Colitis    Depression    Diverticulitis    GERD (gastroesophageal reflux disease)    Headache    Heart murmur    as a child    Hypertension    PONV (postoperative nausea and vomiting)     BP 134/82 (BP Location: Left Arm, Patient Position: Sitting, Cuff Size: Normal)   Pulse 88   Temp 98 F (36.7 C) (Oral)   Resp 16   Ht 5\' 6"  (1.676 m)   Wt 158 lb 12.8 oz (72 kg)   SpO2 96%   BMI 25.63 kg/m  Gen- awake, alert, appearing stated age Heart- RRR, no bruits, no lower extremity edema Lungs- CTAB, no accessory muscle use Neuro-gait is normal 37- age appropriate judgment and insight, normal mood and affect  Attention deficit hyperactivity disorder (ADHD), predominantly inattentive type  Essential hypertension  Encounter for long-term (current) use of high-risk medication - Plan: Drug Monitoring Panel [phone] , Urine  Chronic, stable.  Continue Adderall 30 mg daily.  UDS and CSA updated today. Chronic, stable.  Continue Toprol-XL 50 mg daily, Norvasc 10 mg daily.  Counseled on diet and exercise.  Recommend she add weight resistance exercise  to her regimen. F/u in 6 mo for CPE. Pt voiced understanding and agreement to the plan.  Jilda Roche [place], DO 12/18/22 8:58 AM

## 2022-12-20 LAB — DRUG MONITORING PANEL 376104, URINE
Amphetamine: 4450 ng/mL — ABNORMAL HIGH (ref <250)
Amphetamines: POSITIVE ng/mL — AB (ref <500)
Barbiturates: NEGATIVE ng/mL (ref <300)
Benzodiazepines: NEGATIVE ng/mL (ref <100)
Cocaine Metabolite: NEGATIVE ng/mL (ref <150)
Desmethyltramadol: NEGATIVE ng/mL (ref <100)
Methamphetamine: NEGATIVE ng/mL (ref <250)
Opiates: NEGATIVE ng/mL (ref <100)
Oxycodone: NEGATIVE ng/mL (ref <100)
Tramadol: NEGATIVE ng/mL (ref <100)

## 2022-12-20 LAB — DM TEMPLATE

## 2023-01-01 DIAGNOSIS — H5213 Myopia, bilateral: Secondary | ICD-10-CM | POA: Diagnosis not present

## 2023-01-01 DIAGNOSIS — H52223 Regular astigmatism, bilateral: Secondary | ICD-10-CM | POA: Diagnosis not present

## 2023-01-01 DIAGNOSIS — G43101 Migraine with aura, not intractable, with status migrainosus: Secondary | ICD-10-CM | POA: Diagnosis not present

## 2023-01-13 ENCOUNTER — Other Ambulatory Visit: Payer: Self-pay

## 2023-01-13 ENCOUNTER — Other Ambulatory Visit: Payer: Self-pay | Admitting: Family Medicine

## 2023-01-13 ENCOUNTER — Other Ambulatory Visit (HOSPITAL_COMMUNITY): Payer: Self-pay

## 2023-01-13 DIAGNOSIS — F900 Attention-deficit hyperactivity disorder, predominantly inattentive type: Secondary | ICD-10-CM

## 2023-01-13 MED ORDER — AMPHETAMINE-DEXTROAMPHET ER 30 MG PO CP24
30.0000 mg | ORAL_CAPSULE | ORAL | 0 refills | Status: DC
  Filled 2023-03-16: qty 30, 30d supply, fill #0

## 2023-01-13 MED ORDER — AMPHETAMINE-DEXTROAMPHET ER 30 MG PO CP24
30.0000 mg | ORAL_CAPSULE | ORAL | 0 refills | Status: DC
  Filled 2023-02-12: qty 30, 30d supply, fill #0

## 2023-01-13 MED ORDER — AMPHETAMINE-DEXTROAMPHET ER 30 MG PO CP24
30.0000 mg | ORAL_CAPSULE | ORAL | 0 refills | Status: DC
  Filled 2023-01-13: qty 30, 30d supply, fill #0

## 2023-02-13 ENCOUNTER — Other Ambulatory Visit: Payer: Self-pay

## 2023-03-16 ENCOUNTER — Other Ambulatory Visit: Payer: Self-pay | Admitting: Family Medicine

## 2023-03-16 DIAGNOSIS — I10 Essential (primary) hypertension: Secondary | ICD-10-CM

## 2023-03-17 ENCOUNTER — Other Ambulatory Visit: Payer: Self-pay

## 2023-03-17 MED ORDER — METOPROLOL SUCCINATE ER 50 MG PO TB24
50.0000 mg | ORAL_TABLET | Freq: Every day | ORAL | 0 refills | Status: DC
  Filled 2023-03-17: qty 90, 90d supply, fill #0

## 2023-03-17 MED ORDER — AMLODIPINE BESYLATE 10 MG PO TABS
10.0000 mg | ORAL_TABLET | Freq: Every day | ORAL | 0 refills | Status: DC
  Filled 2023-03-17: qty 90, 90d supply, fill #0

## 2023-03-18 DIAGNOSIS — F419 Anxiety disorder, unspecified: Secondary | ICD-10-CM | POA: Diagnosis not present

## 2023-03-18 DIAGNOSIS — K219 Gastro-esophageal reflux disease without esophagitis: Secondary | ICD-10-CM | POA: Diagnosis not present

## 2023-03-18 DIAGNOSIS — Z309 Encounter for contraceptive management, unspecified: Secondary | ICD-10-CM | POA: Diagnosis not present

## 2023-04-16 ENCOUNTER — Other Ambulatory Visit: Payer: Self-pay

## 2023-04-16 ENCOUNTER — Other Ambulatory Visit (HOSPITAL_COMMUNITY): Payer: Self-pay

## 2023-04-16 ENCOUNTER — Other Ambulatory Visit: Payer: Self-pay | Admitting: Family Medicine

## 2023-04-16 DIAGNOSIS — F900 Attention-deficit hyperactivity disorder, predominantly inattentive type: Secondary | ICD-10-CM

## 2023-04-16 MED ORDER — AMPHETAMINE-DEXTROAMPHET ER 30 MG PO CP24
30.0000 mg | ORAL_CAPSULE | ORAL | 0 refills | Status: DC
  Filled 2023-04-16: qty 30, 30d supply, fill #0

## 2023-04-16 MED ORDER — OMEPRAZOLE 40 MG PO CPDR
40.0000 mg | DELAYED_RELEASE_CAPSULE | Freq: Every day | ORAL | 1 refills | Status: DC
  Filled 2023-04-16: qty 90, 90d supply, fill #0
  Filled 2023-07-18: qty 90, 90d supply, fill #1

## 2023-04-16 MED ORDER — AMPHETAMINE-DEXTROAMPHET ER 30 MG PO CP24
30.0000 mg | ORAL_CAPSULE | ORAL | 0 refills | Status: DC
  Filled 2023-05-17: qty 30, 30d supply, fill #0

## 2023-04-16 MED ORDER — AMPHETAMINE-DEXTROAMPHET ER 30 MG PO CP24
30.0000 mg | ORAL_CAPSULE | ORAL | 0 refills | Status: DC
  Filled 2023-06-15: qty 30, 30d supply, fill #0

## 2023-04-16 NOTE — Telephone Encounter (Signed)
Requesting: Adderall XR 30 mg  Contract: 12/18/2022 UDS: 12/18/2022 Last Visit: 12/18/2022 Next Visit: N/A Last Refill: 03/14/2023  Please Advise

## 2023-04-19 ENCOUNTER — Other Ambulatory Visit (HOSPITAL_COMMUNITY): Payer: Self-pay

## 2023-05-15 DIAGNOSIS — N390 Urinary tract infection, site not specified: Secondary | ICD-10-CM | POA: Diagnosis not present

## 2023-05-15 DIAGNOSIS — R350 Frequency of micturition: Secondary | ICD-10-CM | POA: Diagnosis not present

## 2023-05-15 DIAGNOSIS — R300 Dysuria: Secondary | ICD-10-CM | POA: Diagnosis not present

## 2023-05-17 ENCOUNTER — Other Ambulatory Visit (HOSPITAL_COMMUNITY): Payer: Self-pay

## 2023-05-17 ENCOUNTER — Other Ambulatory Visit (HOSPITAL_BASED_OUTPATIENT_CLINIC_OR_DEPARTMENT_OTHER): Payer: Self-pay

## 2023-05-18 DIAGNOSIS — N854 Malposition of uterus: Secondary | ICD-10-CM | POA: Diagnosis not present

## 2023-05-18 DIAGNOSIS — N390 Urinary tract infection, site not specified: Secondary | ICD-10-CM | POA: Diagnosis not present

## 2023-05-18 DIAGNOSIS — K429 Umbilical hernia without obstruction or gangrene: Secondary | ICD-10-CM | POA: Diagnosis not present

## 2023-05-18 DIAGNOSIS — N3090 Cystitis, unspecified without hematuria: Secondary | ICD-10-CM | POA: Diagnosis not present

## 2023-05-18 DIAGNOSIS — I700 Atherosclerosis of aorta: Secondary | ICD-10-CM | POA: Diagnosis not present

## 2023-05-18 DIAGNOSIS — R1032 Left lower quadrant pain: Secondary | ICD-10-CM | POA: Diagnosis not present

## 2023-05-18 DIAGNOSIS — N132 Hydronephrosis with renal and ureteral calculous obstruction: Secondary | ICD-10-CM | POA: Diagnosis not present

## 2023-05-18 DIAGNOSIS — N136 Pyonephrosis: Secondary | ICD-10-CM | POA: Diagnosis not present

## 2023-05-18 DIAGNOSIS — K5730 Diverticulosis of large intestine without perforation or abscess without bleeding: Secondary | ICD-10-CM | POA: Diagnosis not present

## 2023-05-18 DIAGNOSIS — J438 Other emphysema: Secondary | ICD-10-CM | POA: Diagnosis not present

## 2023-05-18 DIAGNOSIS — N134 Hydroureter: Secondary | ICD-10-CM | POA: Diagnosis not present

## 2023-05-18 DIAGNOSIS — N201 Calculus of ureter: Secondary | ICD-10-CM | POA: Diagnosis not present

## 2023-05-18 DIAGNOSIS — Z87442 Personal history of urinary calculi: Secondary | ICD-10-CM | POA: Diagnosis not present

## 2023-05-18 DIAGNOSIS — R109 Unspecified abdominal pain: Secondary | ICD-10-CM | POA: Diagnosis not present

## 2023-05-23 DIAGNOSIS — N200 Calculus of kidney: Secondary | ICD-10-CM | POA: Diagnosis not present

## 2023-06-15 ENCOUNTER — Other Ambulatory Visit: Payer: Self-pay | Admitting: Family Medicine

## 2023-06-15 DIAGNOSIS — I10 Essential (primary) hypertension: Secondary | ICD-10-CM

## 2023-06-16 ENCOUNTER — Other Ambulatory Visit (HOSPITAL_COMMUNITY): Payer: Self-pay

## 2023-06-16 ENCOUNTER — Other Ambulatory Visit: Payer: Self-pay

## 2023-06-16 MED ORDER — AMLODIPINE BESYLATE 10 MG PO TABS
10.0000 mg | ORAL_TABLET | Freq: Every day | ORAL | 0 refills | Status: DC
Start: 2023-06-16 — End: 2023-09-19
  Filled 2023-06-16: qty 90, 90d supply, fill #0

## 2023-06-16 MED ORDER — METOPROLOL SUCCINATE ER 50 MG PO TB24
50.0000 mg | ORAL_TABLET | Freq: Every day | ORAL | 0 refills | Status: DC
  Filled 2023-06-16: qty 90, 90d supply, fill #0

## 2023-07-18 ENCOUNTER — Other Ambulatory Visit (HOSPITAL_COMMUNITY): Payer: Self-pay

## 2023-07-18 ENCOUNTER — Other Ambulatory Visit: Payer: Self-pay

## 2023-07-18 ENCOUNTER — Other Ambulatory Visit: Payer: Self-pay | Admitting: Family Medicine

## 2023-07-18 DIAGNOSIS — F900 Attention-deficit hyperactivity disorder, predominantly inattentive type: Secondary | ICD-10-CM

## 2023-07-18 MED ORDER — FLUTICASONE PROPIONATE 50 MCG/ACT NA SUSP
2.0000 | Freq: Every day | NASAL | 2 refills | Status: DC
  Filled 2023-07-18: qty 16, 30d supply, fill #0
  Filled 2023-11-13: qty 16, 30d supply, fill #1
  Filled 2023-11-25: qty 32, 60d supply, fill #1

## 2023-07-18 MED ORDER — AMPHETAMINE-DEXTROAMPHET ER 30 MG PO CP24
30.0000 mg | ORAL_CAPSULE | ORAL | 0 refills | Status: DC
  Filled 2023-07-18: qty 30, 30d supply, fill #0

## 2023-07-18 NOTE — Telephone Encounter (Signed)
Pt sent mychart to schedule

## 2023-07-18 NOTE — Telephone Encounter (Signed)
 She's due for an appt. Plz sched. 30 d adderall  sent. Thx.

## 2023-08-18 ENCOUNTER — Other Ambulatory Visit: Payer: Self-pay | Admitting: Family Medicine

## 2023-08-18 ENCOUNTER — Telehealth: Payer: Self-pay

## 2023-08-18 DIAGNOSIS — F900 Attention-deficit hyperactivity disorder, predominantly inattentive type: Secondary | ICD-10-CM

## 2023-08-18 NOTE — Telephone Encounter (Signed)
 Called pt appt schedule for med check tomorrow.

## 2023-08-18 NOTE — Telephone Encounter (Signed)
 Requesting:Adderall  XR 30mg   Contract: 12/18/22 UDS: 12/18/22 Last Visit: 12/18/22 Next Visit: None Last Refill:07/18/23 #30 and 0RF   Please Advise

## 2023-08-19 ENCOUNTER — Ambulatory Visit: Admitting: Family Medicine

## 2023-08-19 VITALS — BP 126/84 | HR 94 | Temp 98.0°F | Resp 16 | Ht 66.0 in | Wt 161.0 lb

## 2023-08-19 DIAGNOSIS — F900 Attention-deficit hyperactivity disorder, predominantly inattentive type: Secondary | ICD-10-CM | POA: Diagnosis not present

## 2023-08-19 DIAGNOSIS — I10 Essential (primary) hypertension: Secondary | ICD-10-CM

## 2023-08-19 MED ORDER — AMPHETAMINE-DEXTROAMPHET ER 30 MG PO CP24: 30.0000 mg | ORAL_CAPSULE | ORAL | 0 refills | Status: DC

## 2023-08-19 NOTE — Progress Notes (Signed)
 Chief Complaint  Patient presents with   Medication Refill    Medication Check     Nichole Mcintosh is 38 y.o. female here for ADHD follow up.  Patient is currently on Adderall  XR 30 mg/d and compliance is excellent. Symptoms are controlled.  Side effects include: none. Patient believes their dose should be not significantly changed. Denies tics, weight loss, difficulties with sleep, self-medication, alcohol /drug abuse, chest pain, or palpitations.  Hypertension Patient presents for hypertension follow up. She does not monitor home blood pressures. She is compliant with medications- Norvasc  10 mg/d, Toprol  XL 50 mg/d. Patient has these side effects of medication: none She is not always adhering to a healthy diet overall. Exercise: walking No CP or SOB.   Past Medical History:  Diagnosis Date   Attention deficit hyperactivity disorder (ADHD) 11/03/2015   Colitis    Depression    Diverticulitis    GERD (gastroesophageal reflux disease)    Headache    Heart murmur    as a child    Hypertension    PONV (postoperative nausea and vomiting)     BP 126/84 (BP Location: Left Arm, Patient Position: Sitting)   Pulse 94   Temp 98 F (36.7 C) (Oral)   Resp 16   Ht 5' 6 (1.676 m)   Wt 161 lb (73 kg)   SpO2 97%   BMI 25.99 kg/m  Gen- awake, alert, appearing stated age Heart- RRR, no bruits, no LE edema Lungs- CTAB, no accessory muscle use Neuro- no facial tics, DTR's equal and symmetric throughout in LE's, no clonus, no cerebellar 68- age appropriate judgment and insight, normal mood and affect  Attention deficit hyperactivity disorder (ADHD), predominantly inattentive type  Essential hypertension  Chronic, stable. Cont Adderall  XR 30 mg/d. CSC updated today.  Chronic, stable. Cont Toprol  XL 50 mg/d, Norvasc  10 mg/d.  F/u in 6 mo for CPE. Pt voiced understanding and agreement to the plan.  Shellie Dials [place], DO 08/19/23 9:41 AM

## 2023-08-19 NOTE — Patient Instructions (Addendum)
 Give [place] 2-3 business days to get the results of your labs back.   Keep the diet clean and stay active.  Let us know if you need anything.

## 2023-08-20 ENCOUNTER — Other Ambulatory Visit: Payer: Self-pay

## 2023-08-20 ENCOUNTER — Ambulatory Visit: Payer: Self-pay | Admitting: Family Medicine

## 2023-08-20 DIAGNOSIS — R748 Abnormal levels of other serum enzymes: Secondary | ICD-10-CM

## 2023-08-20 DIAGNOSIS — E7800 Pure hypercholesterolemia, unspecified: Secondary | ICD-10-CM

## 2023-08-20 DIAGNOSIS — R739 Hyperglycemia, unspecified: Secondary | ICD-10-CM

## 2023-08-20 LAB — LIPID PANEL
Chol/HDL Ratio: 5.9 ratio — ABNORMAL HIGH (ref 0.0–4.4)
Cholesterol, Total: 206 mg/dL — ABNORMAL HIGH (ref 100–199)
HDL: 35 mg/dL — ABNORMAL LOW (ref >39)
LDL Chol Calc (NIH): 152 mg/dL — ABNORMAL HIGH (ref 0–99)
Triglycerides: 103 mg/dL (ref 0–149)
VLDL Cholesterol Cal: 19 mg/dL (ref 5–40)

## 2023-08-20 LAB — COMPREHENSIVE METABOLIC PANEL WITH GFR
ALT: 15 IU/L (ref 0–32)
AST: 16 IU/L (ref 0–40)
Albumin: 4.3 g/dL (ref 3.9–4.9)
Alkaline Phosphatase: 122 IU/L — ABNORMAL HIGH (ref 44–121)
BUN/Creatinine Ratio: 18 (ref 9–23)
BUN: 13 mg/dL (ref 6–24)
Bilirubin Total: 0.4 mg/dL (ref 0.0–1.2)
CO2: 17 mmol/L — ABNORMAL LOW (ref 20–29)
Calcium: 9.4 mg/dL (ref 8.7–10.2)
Chloride: 104 mmol/L (ref 96–106)
Creatinine, Ser: 0.72 mg/dL (ref 0.57–1.00)
Globulin, Total: 2.5 g/dL (ref 1.5–4.5)
Glucose: 108 mg/dL — ABNORMAL HIGH (ref 70–99)
Potassium: 4.3 mmol/L (ref 3.5–5.2)
Sodium: 138 mmol/L (ref 134–144)
Total Protein: 6.8 g/dL (ref 6.0–8.5)
eGFR: 108 mL/min/{1.73_m2} (ref >59)

## 2023-08-20 LAB — HEMOGLOBIN A1C
Est. average glucose Bld gHb Est-mCnc: 143 mg/dL
Hgb A1c MFr Bld: 6.6 % — ABNORMAL HIGH (ref 4.8–5.6)

## 2023-09-19 ENCOUNTER — Other Ambulatory Visit (HOSPITAL_COMMUNITY): Payer: Self-pay

## 2023-09-19 ENCOUNTER — Other Ambulatory Visit: Payer: Self-pay | Admitting: Family Medicine

## 2023-09-19 DIAGNOSIS — I10 Essential (primary) hypertension: Secondary | ICD-10-CM

## 2023-09-19 MED ORDER — AMLODIPINE BESYLATE 10 MG PO TABS
10.0000 mg | ORAL_TABLET | Freq: Every day | ORAL | 0 refills | Status: DC
  Filled 2023-09-19: qty 90, 90d supply, fill #0

## 2023-09-19 MED ORDER — OMEPRAZOLE 40 MG PO CPDR
40.0000 mg | DELAYED_RELEASE_CAPSULE | Freq: Every day | ORAL | 1 refills | Status: DC
  Filled 2023-09-19 – 2023-10-13 (×2): qty 90, 90d supply, fill #0
  Filled 2024-01-10: qty 90, 90d supply, fill #1

## 2023-09-19 MED ORDER — METOPROLOL SUCCINATE ER 50 MG PO TB24
50.0000 mg | ORAL_TABLET | Freq: Every day | ORAL | 0 refills | Status: DC
  Filled 2023-09-19: qty 90, 90d supply, fill #0

## 2023-10-13 ENCOUNTER — Other Ambulatory Visit: Payer: Self-pay

## 2023-10-13 ENCOUNTER — Other Ambulatory Visit (HOSPITAL_COMMUNITY): Payer: Self-pay

## 2023-11-14 ENCOUNTER — Other Ambulatory Visit (HOSPITAL_COMMUNITY): Payer: Self-pay

## 2023-11-14 ENCOUNTER — Other Ambulatory Visit: Payer: Self-pay

## 2023-11-14 ENCOUNTER — Encounter: Payer: Self-pay | Admitting: Pharmacist

## 2023-11-16 ENCOUNTER — Encounter: Payer: Self-pay | Admitting: Family Medicine

## 2023-11-17 ENCOUNTER — Other Ambulatory Visit: Payer: Self-pay | Admitting: Family Medicine

## 2023-11-17 DIAGNOSIS — F900 Attention-deficit hyperactivity disorder, predominantly inattentive type: Secondary | ICD-10-CM

## 2023-11-17 MED ORDER — AMPHETAMINE-DEXTROAMPHET ER 30 MG PO CP24: 30.0000 mg | ORAL_CAPSULE | ORAL | 0 refills | Status: DC

## 2023-11-19 ENCOUNTER — Other Ambulatory Visit: Payer: Self-pay

## 2023-11-25 ENCOUNTER — Other Ambulatory Visit (HOSPITAL_COMMUNITY): Payer: Self-pay

## 2024-01-05 ENCOUNTER — Other Ambulatory Visit (HOSPITAL_COMMUNITY): Payer: Self-pay

## 2024-01-05 ENCOUNTER — Other Ambulatory Visit: Payer: Self-pay | Admitting: Family Medicine

## 2024-01-05 DIAGNOSIS — I10 Essential (primary) hypertension: Secondary | ICD-10-CM

## 2024-01-05 MED ORDER — AMLODIPINE BESYLATE 10 MG PO TABS
10.0000 mg | ORAL_TABLET | Freq: Every day | ORAL | 0 refills | Status: AC
  Filled 2024-01-05: qty 90, 90d supply, fill #0

## 2024-01-05 MED ORDER — METOPROLOL SUCCINATE ER 50 MG PO TB24
50.0000 mg | ORAL_TABLET | Freq: Every day | ORAL | 0 refills | Status: AC
  Filled 2024-01-05: qty 90, 90d supply, fill #0

## 2024-01-10 ENCOUNTER — Other Ambulatory Visit: Payer: Self-pay | Admitting: Family Medicine

## 2024-01-10 ENCOUNTER — Other Ambulatory Visit (HOSPITAL_COMMUNITY): Payer: Self-pay

## 2024-01-12 ENCOUNTER — Other Ambulatory Visit: Payer: Self-pay

## 2024-01-12 ENCOUNTER — Other Ambulatory Visit (HOSPITAL_COMMUNITY): Payer: Self-pay

## 2024-01-12 MED ORDER — FLUTICASONE PROPIONATE 50 MCG/ACT NA SUSP
2.0000 | Freq: Every day | NASAL | 2 refills | Status: AC
  Filled 2024-01-12: qty 16, 30d supply, fill #0

## 2024-01-20 ENCOUNTER — Other Ambulatory Visit (HOSPITAL_COMMUNITY): Payer: Self-pay

## 2024-02-12 ENCOUNTER — Ambulatory Visit: Payer: Self-pay

## 2024-02-12 NOTE — Telephone Encounter (Signed)
 Appt scheduled

## 2024-02-12 NOTE — Telephone Encounter (Signed)
 FYI Only or Action Required?: FYI only for provider: appointment scheduled on 02/13/24.  Patient was last seen in primary care on 08/19/2023 by Frann Mabel Mt, DO.  Called Nurse Triage reporting Sore Throat and Cough.  Symptoms began several weeks ago.  Interventions attempted: OTC medications:   and Rest, hydration, or home remedies.  Symptoms are: gradually worsening.  Triage Disposition: See Physician Within 24 Hours  Patient/caregiver understands and will follow disposition?: Yes  Copied from CRM #[id number]. Topic: Clinical - Red Word Triage >> Feb 12, 2024 10:26 AM Ahlexyia S wrote: Red Word that prompted transfer to Nurse Triage: Pt called in stating that she has been sick for the past 2 weeks. Pt is experiencing chest congestion, tan mucus and worsening cough. Pt stated she has taken OTC medication but nothing seems to be working. Pt denied having a fever and any other symptoms at this time. Warm transferred pt to nurse triage. >> Feb 12, 2024 12:34 PM Dedra B wrote: Pt calling back to speak with NT. Warm transfer to NT Reason for Disposition  Earache also present  Answer Assessment - Initial Assessment Questions Pt called in with worsening cough and chest congestion which has resulted in a sore throat. 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 Pt states sinus congestion in ears is present and has a barky cough. Pt denies fever. States she has tried everything OTC and nothing has been effective at this time. Pt would prefer in person appt so she can have a chest xray. Appointment scheduled for evaluation. Patient agrees with plan of care, and will call back if anything changes, or if symptoms worsen.     1. ONSET: When did the throat start hurting? (Hours or days ago)      2 weeks ago   2. SEVERITY: How bad is the sore throat? (Scale 1-10; mild, moderate or severe)     Worse today; pt states she started to feel better then  hit a plateau and now congestion and everything is worse   3. STREP EXPOSURE: Has there been any exposure to strep within the past week? If Yes, ask: What type of contact occurred?      Unknown; pt is an ICU nurse   4.  VIRAL SYMPTOMS: Are there any symptoms of a cold, such as a runny nose, cough, hoarse voice or red eyes?      Hoarse voice, chest congestion, tan mucous  5. FEVER: Do you have a fever? If Yes, ask: What is your temperature, how was it measured, and when did it start?     Denies fever   7. OTHER SYMPTOMS: Do you have any other symptoms? (e.g., difficulty breathing, headache, rash)     No  Protocols used: Sore Throat-A-AH

## 2024-02-13 ENCOUNTER — Ambulatory Visit: Payer: Self-pay | Admitting: Student

## 2024-02-13 ENCOUNTER — Ambulatory Visit (HOSPITAL_BASED_OUTPATIENT_CLINIC_OR_DEPARTMENT_OTHER)
Admission: RE | Admit: 2024-02-13 | Discharge: 2024-02-13 | Disposition: A | Source: Ambulatory Visit | Attending: Student

## 2024-02-13 ENCOUNTER — Ambulatory Visit: Admitting: Student

## 2024-02-13 VITALS — BP 157/92 | HR 95 | Ht 66.0 in | Wt 160.2 lb

## 2024-02-13 DIAGNOSIS — B9689 Other specified bacterial agents as the cause of diseases classified elsewhere: Secondary | ICD-10-CM

## 2024-02-13 DIAGNOSIS — J019 Acute sinusitis, unspecified: Secondary | ICD-10-CM

## 2024-02-13 MED ORDER — GUAIFENESIN ER 600 MG PO TB12: 1200.0000 mg | ORAL_TABLET | Freq: Two times a day (BID) | ORAL | 0 refills | Status: DC

## 2024-02-13 MED ORDER — BENZONATATE 100 MG PO CAPS: 100.0000 mg | ORAL_CAPSULE | Freq: Three times a day (TID) | ORAL | 0 refills | Status: DC | PRN

## 2024-02-13 MED ORDER — AMOXICILLIN-POT CLAVULANATE 875-125 MG PO TABS: 1.0000 | ORAL_TABLET | Freq: Two times a day (BID) | ORAL | 0 refills | Status: AC

## 2024-02-13 MED ORDER — ALBUTEROL SULFATE HFA 108 (90 BASE) MCG/ACT IN AERS: 2.0000 | INHALATION_SPRAY | Freq: Four times a day (QID) | RESPIRATORY_TRACT | 2 refills | Status: DC | PRN

## 2024-02-13 MED ORDER — FLUCONAZOLE 150 MG PO TABS: 150.0000 mg | ORAL_TABLET | Freq: Every day | ORAL | 0 refills | Status: DC

## 2024-02-13 NOTE — Progress Notes (Signed)
 No chief complaint on file.   Nichole Mcintosh here for URI complaints.  History of Present Illness Nichole Mcintosh is a 38 year old female who presents with upper respiratory symptoms persisting for two weeks.  Symptoms began 2 weeks ago, worse at night, followed by green then tan mucus. She has intermittent sinus pressure, ear discomfort, and chest congestion that fluctuate day to day. Nighttime symptoms are most problematic, with a persistent cough that disrupts sleep and shortness of breath when lying flat. She feels better sleeping with her head elevated on three pillows or on the couch. A home COVID test was negative. She had her flu vaccine. She has tried Mucinex, fluticasone , several antihistamines, dextromethorphan, and both pseudoephedrine and non-pseudoephedrine decongestants with partial relief.  She denies measured fever but feels subjectively hot and cold at times. 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  Patient denies fever, chills, SOB, CP, palpitations, dyspnea, edema, HA, vision changes, N/V/D, abdominal pain, urinary symptoms, rash, weight changes, and recent illness or hospitalizations.    Past Medical History:  Diagnosis Date   Attention deficit hyperactivity disorder (ADHD) 11/03/2015   Colitis    Depression    Diverticulitis    GERD (gastroesophageal reflux disease)    Headache    Heart murmur    as a child    Hypertension    PONV (postoperative nausea and vomiting)     Objective BP (!) 157/92   Pulse 95   Ht 5' 6 (1.676 m)   Wt 160 lb 3.2 oz (72.7 kg)   SpO2 97%   PF (!) 2 L/min   BMI 25.86 kg/m  General: Awake, alert, appears stated age HEENT: AT, [place], ears patent b/l and TM's neg, nares patent w/o discharge, pharynx pink and without exudates, MMM Neck: No masses or asymmetry Heart: RRR Lungs: CTAB,diminished no accessory  muscle use Psych: Age appropriate judgment and insight, normal mood and affect  Acute bacterial sinusitis - Plan: amoxicillin -clavulanate (AUGMENTIN ) 875-125 MG tablet, guaiFENesin (MUCINEX) 600 MG 12 hr tablet, benzonatate (TESSALON) 100 MG capsule, albuterol (VENTOLIN HFA) 108 (90 Base) MCG/ACT inhaler, DG Chest 2 View, fluconazole  (DIFLUCAN ) 150 MG tablet   Acute bacterial sinusitis Negative COVID home test.  - Continue Mucinex and hydration. -Rx Augmentin  -Rx- albuterol, Tessalon - CXR to r/o PNA - Recommended probiotics. - Continue nasal rinses and Flonase .  Elevated blood pressure - Monitor blood pressure regularly. - Report if blood pressure exceeds 140/90 mmHg.  Continue to push fluids, practice good hand hygiene, cover mouth when coughing. F/u prn. If starting to experience fevers, shaking, or shortness of breath, seek immediate care. Pt voiced understanding and agreement to the plan.  Harlene LITTIE Jolly, DNP, AGNP-C 02/13/2024 2:16 PM

## 2024-02-18 ENCOUNTER — Other Ambulatory Visit: Payer: Self-pay | Admitting: Family Medicine

## 2024-02-18 ENCOUNTER — Other Ambulatory Visit: Payer: Self-pay

## 2024-02-18 ENCOUNTER — Encounter: Payer: Self-pay | Admitting: Family Medicine

## 2024-02-18 ENCOUNTER — Other Ambulatory Visit (HOSPITAL_COMMUNITY): Payer: Self-pay

## 2024-02-18 DIAGNOSIS — F900 Attention-deficit hyperactivity disorder, predominantly inattentive type: Secondary | ICD-10-CM

## 2024-02-18 MED ORDER — AMPHETAMINE-DEXTROAMPHET ER 30 MG PO CP24
30.0000 mg | ORAL_CAPSULE | ORAL | 0 refills | Status: DC
  Filled 2024-02-18: qty 30, 30d supply, fill #0

## 2024-02-18 NOTE — Telephone Encounter (Signed)
 Requesting: Adderall  XR 30mg  Contract: 09/01/23 UDS: 12/18/22 Last Visit: 08/19/23 Next Visit: None Last Refill: see med list   Please Advise

## 2024-02-20 ENCOUNTER — Other Ambulatory Visit: Payer: Self-pay | Admitting: Family Medicine

## 2024-02-20 ENCOUNTER — Other Ambulatory Visit (HOSPITAL_COMMUNITY): Payer: Self-pay

## 2024-02-20 ENCOUNTER — Other Ambulatory Visit (HOSPITAL_BASED_OUTPATIENT_CLINIC_OR_DEPARTMENT_OTHER): Payer: Self-pay

## 2024-02-20 ENCOUNTER — Other Ambulatory Visit: Payer: Self-pay

## 2024-02-20 DIAGNOSIS — I10 Essential (primary) hypertension: Secondary | ICD-10-CM

## 2024-02-20 DIAGNOSIS — F900 Attention-deficit hyperactivity disorder, predominantly inattentive type: Secondary | ICD-10-CM

## 2024-02-20 MED ORDER — METOPROLOL SUCCINATE ER 50 MG PO TB24
50.0000 mg | ORAL_TABLET | Freq: Every day | ORAL | 0 refills | Status: DC
  Filled 2024-02-20: qty 90, 90d supply, fill #0

## 2024-02-20 MED ORDER — AMLODIPINE BESYLATE 10 MG PO TABS
10.0000 mg | ORAL_TABLET | Freq: Every day | ORAL | 0 refills | Status: DC
  Filled 2024-02-20: qty 90, 90d supply, fill #0

## 2024-02-20 MED ORDER — OMEPRAZOLE 40 MG PO CPDR
40.0000 mg | DELAYED_RELEASE_CAPSULE | Freq: Every day | ORAL | 1 refills | Status: DC
  Filled 2024-02-20: qty 90, 90d supply, fill #0

## 2024-02-27 ENCOUNTER — Other Ambulatory Visit (HOSPITAL_COMMUNITY): Payer: Self-pay

## 2024-03-08 ENCOUNTER — Encounter: Payer: Self-pay | Admitting: Family Medicine

## 2024-03-08 ENCOUNTER — Other Ambulatory Visit: Payer: Self-pay

## 2024-03-08 ENCOUNTER — Ambulatory Visit: Payer: Self-pay | Admitting: Family Medicine

## 2024-03-08 ENCOUNTER — Ambulatory Visit (INDEPENDENT_AMBULATORY_CARE_PROVIDER_SITE_OTHER): Admitting: Family Medicine

## 2024-03-08 VITALS — BP 136/84 | HR 100 | Temp 98.0°F | Resp 16 | Ht 66.0 in | Wt 161.0 lb

## 2024-03-08 DIAGNOSIS — Z1231 Encounter for screening mammogram for malignant neoplasm of breast: Secondary | ICD-10-CM | POA: Diagnosis not present

## 2024-03-08 DIAGNOSIS — I10 Essential (primary) hypertension: Secondary | ICD-10-CM | POA: Diagnosis not present

## 2024-03-08 DIAGNOSIS — R7303 Prediabetes: Secondary | ICD-10-CM

## 2024-03-08 DIAGNOSIS — Z79899 Other long term (current) drug therapy: Secondary | ICD-10-CM | POA: Diagnosis not present

## 2024-03-08 DIAGNOSIS — Z0000 Encounter for general adult medical examination without abnormal findings: Secondary | ICD-10-CM | POA: Diagnosis not present

## 2024-03-08 LAB — LIPID PANEL
Cholesterol: 234 mg/dL — ABNORMAL HIGH (ref 28–200)
HDL: 51.2 mg/dL
LDL Cholesterol: 163 mg/dL — ABNORMAL HIGH (ref 10–99)
NonHDL: 182.74
Total CHOL/HDL Ratio: 5
Triglycerides: 101 mg/dL (ref 10.0–149.0)
VLDL: 20.2 mg/dL (ref 0.0–40.0)

## 2024-03-08 LAB — COMPREHENSIVE METABOLIC PANEL WITH GFR
ALT: 17 U/L (ref 3–35)
AST: 15 U/L (ref 5–37)
Albumin: 4.4 g/dL (ref 3.5–5.2)
Alkaline Phosphatase: 105 U/L (ref 39–117)
BUN: 12 mg/dL (ref 6–23)
CO2: 26 meq/L (ref 19–32)
Calcium: 9.5 mg/dL (ref 8.4–10.5)
Chloride: 104 meq/L (ref 96–112)
Creatinine, Ser: 0.61 mg/dL (ref 0.40–1.20)
GFR: 111.42 mL/min
Glucose, Bld: 153 mg/dL — ABNORMAL HIGH (ref 70–99)
Potassium: 3.5 meq/L (ref 3.5–5.1)
Sodium: 139 meq/L (ref 135–145)
Total Bilirubin: 0.4 mg/dL (ref 0.2–1.2)
Total Protein: 7 g/dL (ref 6.0–8.3)

## 2024-03-08 LAB — CBC
HCT: 42.4 % (ref 36.0–46.0)
Hemoglobin: 14.3 g/dL (ref 12.0–15.0)
MCHC: 33.6 g/dL (ref 30.0–36.0)
MCV: 91.6 fl (ref 78.0–100.0)
Platelets: 377 K/uL (ref 150.0–400.0)
RBC: 4.63 Mil/uL (ref 3.87–5.11)
RDW: 14 % (ref 11.5–15.5)
WBC: 13.5 K/uL — ABNORMAL HIGH (ref 4.0–10.5)

## 2024-03-08 LAB — HEMOGLOBIN A1C: Hgb A1c MFr Bld: 6.9 % — ABNORMAL HIGH (ref 4.6–6.5)

## 2024-03-08 MED ORDER — AMLODIPINE BESYLATE 10 MG PO TABS: 10.0000 mg | ORAL_TABLET | Freq: Every day | ORAL | 1 refills | Status: AC

## 2024-03-08 MED ORDER — DOXYCYCLINE HYCLATE 100 MG PO TABS: 100.0000 mg | ORAL_TABLET | Freq: Two times a day (BID) | ORAL | 0 refills | Status: AC

## 2024-03-08 MED ORDER — OMEPRAZOLE 40 MG PO CPDR: 40.0000 mg | DELAYED_RELEASE_CAPSULE | Freq: Every day | ORAL | 1 refills | Status: AC

## 2024-03-08 MED ORDER — METFORMIN HCL 500 MG PO TABS
ORAL_TABLET | ORAL | 1 refills | Status: DC
  Filled 2024-03-08 – 2024-03-19 (×2): qty 120, 30d supply, fill #0

## 2024-03-08 MED ORDER — FLUCONAZOLE 150 MG PO TABS: ORAL_TABLET | ORAL | 0 refills | Status: AC

## 2024-03-08 MED ORDER — METOPROLOL SUCCINATE ER 50 MG PO TB24: 50.0000 mg | ORAL_TABLET | Freq: Every day | ORAL | 1 refills | Status: DC

## 2024-03-08 NOTE — Patient Instructions (Signed)
 Give [place] 2-3 business days to get the results of your labs back.   Keep the diet clean and stay active.  Please get me a copy of your advanced directive form at your convenience.   Let us know if you need anything.

## 2024-03-08 NOTE — Progress Notes (Signed)
 Chief Complaint  Patient presents with   Annual Exam    CPE     Well Woman Nichole Mcintosh is here for a complete physical.   Her last physical was >1 year ago.  Current diet: in general, a healthy diet. Current exercise: walking at work. Weight is stable and she denies fatigue out of ordinary. No LMP recorded (lmp unknown). (Menstrual status: IUD). Seatbelt? Yes Advanced directive? No  Health Maintenance Pap/HPV- Yes Mammogram- No Tetanus- Yes Hep C screening- Yes HIV screening- Yes  Past Medical History:  Diagnosis Date   Attention deficit hyperactivity disorder (ADHD) 11/03/2015   Colitis    Depression    Diverticulitis    GERD (gastroesophageal reflux disease)    Headache    Heart murmur    as a child    Hypertension    PONV (postoperative nausea and vomiting)      Past Surgical History:  Procedure Laterality Date   COLON SURGERY  2011   Partial colectomy, diverticulitis   COLONOSCOPY WITH PROPOFOL  N/A 08/01/2016   Procedure: COLONOSCOPY WITH PROPOFOL ;  Surgeon: Legrand Victory LITTIE DOUGLAS, MD;  Location: [facility];  Service: Gastroenterology;  Laterality: N/A;   TUBAL LIGATION  2004    Medications  Medications Ordered Prior to Encounter[1]   Allergies Allergies[2]  Review of Systems: Constitutional:  no unexpected weight changes Eye:  no recent significant change in vision Ear/Nose/Mouth/Throat:  Ears:  no recent change in hearing Nose/Mouth/Throat:  no complaints of nasal congestion, no sore throat Cardiovascular: no chest pain Respiratory:  no shortness of breath Gastrointestinal:  no abdominal pain, no change in bowel habits GU:  Female: negative for dysuria or pelvic pain Musculoskeletal/Extremities:  no pain of the joints Integumentary (Skin/Breast):  no abnormal skin lesions reported Neurologic:  no headaches Endocrine:  denies fatigue Hematologic/Lymphatic:  No areas of easy bleeding  Exam BP 136/84 (BP Location: Left Arm, Patient Position:  Sitting)   Pulse 100   Temp 98 F (36.7 C) (Oral)   Resp 16   Ht 5' 6 (1.676 m)   Wt 161 lb (73 kg)   LMP  (LMP Unknown)   SpO2 99%   BMI 25.99 kg/m  General:  well developed, well nourished, in no apparent distress Skin:  no significant moles, warts, or growths Head:  no masses, lesions, or tenderness Eyes:  pupils equal and round, sclera anicteric without injection Ears:  canals without lesions, TMs shiny without retraction, no obvious effusion, no erythema Nose:  nares patent, mucosa normal, and no drainage Throat/Pharynx:  lips and gingiva without lesion; tongue and uvula midline; non-inflamed pharynx; no exudates or postnasal drainage Neck: neck supple without adenopathy, thyromegaly, or masses Lungs:  clear to auscultation, breath sounds equal bilaterally, no respiratory distress Cardio:  regular rate and rhythm, no LE edema Abdomen:  abdomen soft, nontender; bowel sounds normal; no masses or organomegaly Genital: Defer to GYN Musculoskeletal:  symmetrical muscle groups noted without atrophy or deformity Extremities:  no clubbing, cyanosis, or edema, no deformities, no skin discoloration Neuro:  gait normal; deep tendon reflexes normal and symmetric Psych: well oriented with normal range of affect and appropriate judgment/insight  Assessment and Plan  Well adult exam - Plan: Comprehensive metabolic panel with GFR, CBC, Lipid panel  High risk medication use - Plan: Drug Monitoring Panel [phone] , Urine  Encounter for screening mammogram for malignant neoplasm of breast - Plan: MM DIGITAL SCREENING BILATERAL  Prediabetes - Plan: Hemoglobin A1c  Essential hypertension - Plan: amLODipine  (NORVASC )  10 MG tablet, metoprolol  succinate (TOPROL -XL) 50 MG 24 hr tablet   Well 39 y.o. female. Counseled on diet and exercise. Advanced directive form provided today.  Mammogram ordered. UDS and CSC updated today.  If A1c 6.5 or greater, she will have dm. Will send in metformin ,  consider changing Toprol  and Norvasc  to ARB.  Will also consider adding a statin. Other orders as above. Follow up in 6 mo. The patient voiced understanding and agreement to the plan.  Nichole Mt [place], DO 03/08/2024 9:38 AM     [1]  Current Outpatient Medications on File Prior to Visit  Medication Sig Dispense Refill   amphetamine -dextroamphetamine  (ADDERALL  XR) 30 MG 24 hr capsule Take 1 capsule (30 mg total) by mouth every morning. 30 capsule 0   amphetamine -dextroamphetamine  (ADDERALL  XR) 30 MG 24 hr capsule Take 1 capsule (30 mg total) by mouth every morning. 30 capsule 0   amphetamine -dextroamphetamine  (ADDERALL  XR) 30 MG 24 hr capsule Take 1 capsule (30 mg total) by mouth every morning. 30 capsule 0   cetirizine (ZYRTEC) 10 MG tablet Take 10 mg by mouth daily.     fluticasone  (FLONASE ) 50 MCG/ACT nasal spray Place 2 sprays into both nostrils daily. 16 g 2   levonorgestrel  (MIRENA ) 20 MCG/DAY IUD 1 each by Intrauterine route once.     No current facility-administered medications on file prior to visit.  [2] No Known Allergies

## 2024-03-09 ENCOUNTER — Other Ambulatory Visit: Payer: Self-pay

## 2024-03-09 ENCOUNTER — Other Ambulatory Visit (HOSPITAL_COMMUNITY): Payer: Self-pay

## 2024-03-10 LAB — DM TEMPLATE

## 2024-03-11 ENCOUNTER — Other Ambulatory Visit: Payer: Self-pay

## 2024-03-11 LAB — DRUG MONITORING PANEL 376104, URINE
Amphetamine: 5121 ng/mL — ABNORMAL HIGH
Amphetamines: POSITIVE ng/mL — AB
Barbiturates: NEGATIVE ng/mL
Benzodiazepines: NEGATIVE ng/mL
Cocaine Metabolite: NEGATIVE ng/mL
Desmethyltramadol: NEGATIVE ng/mL
Methamphetamine: NEGATIVE ng/mL
Opiates: NEGATIVE ng/mL
Oxycodone: NEGATIVE ng/mL
Tramadol: NEGATIVE ng/mL

## 2024-03-11 LAB — DM TEMPLATE

## 2024-03-12 ENCOUNTER — Other Ambulatory Visit: Payer: Self-pay | Admitting: Family Medicine

## 2024-03-12 DIAGNOSIS — I10 Essential (primary) hypertension: Secondary | ICD-10-CM

## 2024-03-12 MED ORDER — NICOTINE 7 MG/24HR TD PT24: 7.0000 mg | MEDICATED_PATCH | Freq: Every day | TRANSDERMAL | 0 refills | Status: AC

## 2024-03-12 MED ORDER — METOPROLOL SUCCINATE ER 50 MG PO TB24: 50.0000 mg | ORAL_TABLET | Freq: Every day | ORAL | 1 refills | Status: AC

## 2024-03-12 MED ORDER — NICOTINE 14 MG/24HR TD PT24: 14.0000 mg | MEDICATED_PATCH | Freq: Every day | TRANSDERMAL | 0 refills | Status: AC

## 2024-03-12 MED ORDER — ROSUVASTATIN CALCIUM 20 MG PO TABS: 20.0000 mg | ORAL_TABLET | Freq: Every day | ORAL | 3 refills | Status: AC

## 2024-03-12 MED ORDER — NICOTINE 21 MG/24HR TD PT24: 21.0000 mg | MEDICATED_PATCH | Freq: Every day | TRANSDERMAL | 0 refills | Status: AC

## 2024-03-19 ENCOUNTER — Other Ambulatory Visit (HOSPITAL_COMMUNITY): Payer: Self-pay

## 2024-03-20 ENCOUNTER — Other Ambulatory Visit (HOSPITAL_COMMUNITY): Payer: Self-pay

## 2024-03-20 ENCOUNTER — Encounter: Payer: Self-pay | Admitting: Family Medicine

## 2024-03-22 ENCOUNTER — Other Ambulatory Visit: Payer: Self-pay | Admitting: Family Medicine

## 2024-03-22 DIAGNOSIS — F900 Attention-deficit hyperactivity disorder, predominantly inattentive type: Secondary | ICD-10-CM

## 2024-03-22 MED ORDER — AMPHETAMINE-DEXTROAMPHET ER 30 MG PO CP24: 30.0000 mg | ORAL_CAPSULE | ORAL | 0 refills | Status: AC

## 2024-03-29 ENCOUNTER — Other Ambulatory Visit: Payer: Self-pay | Admitting: Family Medicine

## 2024-03-29 MED ORDER — TIRZEPATIDE 5 MG/0.5ML ~~LOC~~ SOAJ: 5.0000 mg | SUBCUTANEOUS | 0 refills | Status: AC

## 2024-03-29 MED ORDER — TIRZEPATIDE 7.5 MG/0.5ML ~~LOC~~ SOAJ: 7.5000 mg | SUBCUTANEOUS | 0 refills | Status: AC

## 2024-03-29 MED ORDER — TIRZEPATIDE 2.5 MG/0.5ML ~~LOC~~ SOAJ: 2.5000 mg | SUBCUTANEOUS | 0 refills | Status: AC
